# Patient Record
Sex: Male | Born: 1990 | Hispanic: Yes | Marital: Single | Smoking: Never smoker
Health system: Southern US, Community
[De-identification: ages and names within clinical notes are randomized; demographics above are authoritative.]

## PROBLEM LIST (undated history)

## (undated) DIAGNOSIS — R7303 Prediabetes: Secondary | ICD-10-CM

---

## 2017-08-12 ENCOUNTER — Emergency Department (HOSPITAL_COMMUNITY): Payer: Self-pay

## 2017-08-12 ENCOUNTER — Inpatient Hospital Stay (HOSPITAL_COMMUNITY): Payer: Self-pay | Admitting: Certified Registered Nurse Anesthetist

## 2017-08-12 ENCOUNTER — Inpatient Hospital Stay (HOSPITAL_COMMUNITY)
Admission: EM | Admit: 2017-08-12 | Discharge: 2017-08-14 | DRG: 339 | Disposition: A | Discharge status: Home / Self Care | Payer: Self-pay | Attending: General Surgery | Admitting: General Surgery

## 2017-08-12 ENCOUNTER — Encounter (HOSPITAL_COMMUNITY): Payer: Self-pay | Admitting: Emergency Medicine

## 2017-08-12 ENCOUNTER — Encounter (HOSPITAL_COMMUNITY): Admission: EM | Disposition: A | Discharge status: Home / Self Care | Payer: Self-pay | Source: Home / Self Care

## 2017-08-12 ENCOUNTER — Other Ambulatory Visit: Payer: Self-pay

## 2017-08-12 DIAGNOSIS — R7401 Elevation of levels of liver transaminase levels: Secondary | ICD-10-CM

## 2017-08-12 DIAGNOSIS — Z6841 Body Mass Index (BMI) 40.0 and over, adult: Secondary | ICD-10-CM

## 2017-08-12 DIAGNOSIS — E785 Hyperlipidemia, unspecified: Secondary | ICD-10-CM | POA: Diagnosis present

## 2017-08-12 DIAGNOSIS — K760 Fatty (change of) liver, not elsewhere classified: Secondary | ICD-10-CM | POA: Diagnosis present

## 2017-08-12 DIAGNOSIS — R7303 Prediabetes: Secondary | ICD-10-CM | POA: Diagnosis present

## 2017-08-12 DIAGNOSIS — D729 Disorder of white blood cells, unspecified: Secondary | ICD-10-CM

## 2017-08-12 DIAGNOSIS — R110 Nausea: Secondary | ICD-10-CM

## 2017-08-12 DIAGNOSIS — R740 Nonspecific elevation of levels of transaminase and lactic acid dehydrogenase [LDH]: Secondary | ICD-10-CM

## 2017-08-12 DIAGNOSIS — K3532 Acute appendicitis with perforation and localized peritonitis, without abscess: Principal | ICD-10-CM | POA: Diagnosis present

## 2017-08-12 DIAGNOSIS — R1031 Right lower quadrant pain: Secondary | ICD-10-CM

## 2017-08-12 HISTORY — PX: APPENDECTOMY: SHX54

## 2017-08-12 HISTORY — PX: LAPAROSCOPIC APPENDECTOMY: SHX408

## 2017-08-12 HISTORY — DX: Prediabetes: R73.03

## 2017-08-12 LAB — CBC
HEMATOCRIT: 44.7 % (ref 39.0–52.0)
HEMOGLOBIN: 14.9 g/dL (ref 13.0–17.0)
MCH: 30 pg (ref 26.0–34.0)
MCHC: 33.3 g/dL (ref 30.0–36.0)
MCV: 90.1 fL (ref 78.0–100.0)
Platelets: 299 10*3/uL (ref 150–400)
RBC: 4.96 MIL/uL (ref 4.22–5.81)
RDW: 13.3 % (ref 11.5–15.5)
WBC: 19.6 10*3/uL — ABNORMAL HIGH (ref 4.0–10.5)

## 2017-08-12 LAB — COMPREHENSIVE METABOLIC PANEL
ALBUMIN: 4.3 g/dL (ref 3.5–5.0)
ALT: 143 U/L — ABNORMAL HIGH (ref 17–63)
ANION GAP: 12 (ref 5–15)
AST: 45 U/L — ABNORMAL HIGH (ref 15–41)
Alkaline Phosphatase: 70 U/L (ref 38–126)
BILIRUBIN TOTAL: 1.1 mg/dL (ref 0.3–1.2)
BUN: 9 mg/dL (ref 6–20)
CHLORIDE: 99 mmol/L — AB (ref 101–111)
CO2: 25 mmol/L (ref 22–32)
Calcium: 9.6 mg/dL (ref 8.9–10.3)
Creatinine, Ser: 1 mg/dL (ref 0.61–1.24)
GFR calc Af Amer: >60 mL/min (ref >60)
GFR calc non Af Amer: >60 mL/min (ref >60)
GLUCOSE: 115 mg/dL — AB (ref 65–99)
POTASSIUM: 4.1 mmol/L (ref 3.5–5.1)
SODIUM: 136 mmol/L (ref 135–145)
TOTAL PROTEIN: 8.6 g/dL — AB (ref 6.5–8.1)

## 2017-08-12 LAB — URINALYSIS, ROUTINE W REFLEX MICROSCOPIC
BILIRUBIN URINE: NEGATIVE
Glucose, UA: NEGATIVE mg/dL
Hgb urine dipstick: NEGATIVE
Ketones, ur: NEGATIVE mg/dL
LEUKOCYTES UA: NEGATIVE
NITRITE: NEGATIVE
PH: 6 (ref 5.0–8.0)
Protein, ur: NEGATIVE mg/dL
SPECIFIC GRAVITY, URINE: 1.026 (ref 1.005–1.030)

## 2017-08-12 LAB — DIFFERENTIAL
ABS IMMATURE GRANULOCYTES: 0.1 10*3/uL (ref 0.0–0.1)
BASOS ABS: 0.1 10*3/uL (ref 0.0–0.1)
Basophils Relative: 0 %
Eosinophils Absolute: 0 10*3/uL (ref 0.0–0.7)
Eosinophils Relative: 0 %
Immature Granulocytes: 1 %
Lymphocytes Relative: 12 %
Lymphs Abs: 2.4 10*3/uL (ref 0.7–4.0)
Monocytes Absolute: 2.3 10*3/uL — ABNORMAL HIGH (ref 0.1–1.0)
Monocytes Relative: 12 %
NEUTROS ABS: 14.6 10*3/uL — AB (ref 1.7–7.7)
Neutrophils Relative %: 75 %

## 2017-08-12 LAB — I-STAT CG4 LACTIC ACID, ED: Lactic Acid, Venous: 1.94 mmol/L — ABNORMAL HIGH (ref 0.5–1.9)

## 2017-08-12 LAB — LIPASE, BLOOD: LIPASE: 29 U/L (ref 11–51)

## 2017-08-12 SURGERY — APPENDECTOMY, LAPAROSCOPIC
Anesthesia: General | Site: Abdomen

## 2017-08-12 MED ORDER — BUPIVACAINE HCL 0.25 % IJ SOLN
INTRAMUSCULAR | Status: DC | PRN
  Administered 2017-08-12: 5 mL

## 2017-08-12 MED ORDER — PIPERACILLIN-TAZOBACTAM 3.375 G IVPB
3.3750 g | Freq: Three times a day (TID) | INTRAVENOUS | Status: DC
  Administered 2017-08-12: 3.375 g via INTRAVENOUS
  Filled 2017-08-12: qty 50

## 2017-08-12 MED ORDER — HYDRALAZINE HCL 20 MG/ML IJ SOLN: 10.0000 mg | INTRAMUSCULAR | Status: DC | PRN

## 2017-08-12 MED ORDER — LIDOCAINE HCL (CARDIAC) PF 100 MG/5ML IV SOSY
PREFILLED_SYRINGE | INTRAVENOUS | Status: DC | PRN
  Administered 2017-08-12: 50 mg via INTRAVENOUS

## 2017-08-12 MED ORDER — HYDROMORPHONE HCL 2 MG/ML IJ SOLN: 1.0000 mg | INTRAMUSCULAR | Status: DC | PRN

## 2017-08-12 MED ORDER — HYDROMORPHONE HCL 1 MG/ML IJ SOLN: 1.0000 mg | INTRAMUSCULAR | Status: DC | PRN

## 2017-08-12 MED ORDER — DEXAMETHASONE SODIUM PHOSPHATE 10 MG/ML IJ SOLN
INTRAMUSCULAR | Status: DC | PRN
  Administered 2017-08-12: 10 mg via INTRAVENOUS

## 2017-08-12 MED ORDER — DIPHENHYDRAMINE HCL 25 MG PO CAPS: 25.0000 mg | ORAL_CAPSULE | Freq: Four times a day (QID) | ORAL | Status: DC | PRN

## 2017-08-12 MED ORDER — MORPHINE SULFATE (PF) 4 MG/ML IV SOLN
4.0000 mg | Freq: Once | INTRAVENOUS | Status: AC
Start: 2017-08-12 — End: 2017-08-12
  Administered 2017-08-12: 4 mg via INTRAVENOUS
  Filled 2017-08-12: qty 1

## 2017-08-12 MED ORDER — GABAPENTIN 300 MG PO CAPS
300.0000 mg | ORAL_CAPSULE | Freq: Two times a day (BID) | ORAL | Status: DC
  Administered 2017-08-12 – 2017-08-13 (×3): 300 mg via ORAL
  Filled 2017-08-12 (×3): qty 1

## 2017-08-12 MED ORDER — PROPOFOL 10 MG/ML IV BOLUS
INTRAVENOUS | Status: DC | PRN
  Administered 2017-08-12: 200 mg via INTRAVENOUS

## 2017-08-12 MED ORDER — ONDANSETRON HCL 4 MG/2ML IJ SOLN: 4.0000 mg | Freq: Four times a day (QID) | INTRAMUSCULAR | Status: DC | PRN

## 2017-08-12 MED ORDER — SUGAMMADEX SODIUM 500 MG/5ML IV SOLN
INTRAVENOUS | Status: AC
  Filled 2017-08-12: qty 5

## 2017-08-12 MED ORDER — SUGAMMADEX SODIUM 500 MG/5ML IV SOLN
INTRAVENOUS | Status: DC | PRN
  Administered 2017-08-12: 300 mg via INTRAVENOUS

## 2017-08-12 MED ORDER — HYDROMORPHONE HCL 2 MG/ML IJ SOLN: 0.2500 mg | INTRAMUSCULAR | Status: DC | PRN

## 2017-08-12 MED ORDER — CELECOXIB 200 MG PO CAPS
200.0000 mg | ORAL_CAPSULE | Freq: Two times a day (BID) | ORAL | Status: DC
  Administered 2017-08-12 – 2017-08-13 (×3): 200 mg via ORAL
  Filled 2017-08-12 (×3): qty 1

## 2017-08-12 MED ORDER — IOHEXOL 300 MG/ML  SOLN: 100.0000 mL | Freq: Once | INTRAMUSCULAR | Status: DC | PRN

## 2017-08-12 MED ORDER — SUCCINYLCHOLINE CHLORIDE 200 MG/10ML IV SOSY
PREFILLED_SYRINGE | INTRAVENOUS | Status: AC
  Filled 2017-08-12: qty 10

## 2017-08-12 MED ORDER — IOHEXOL 300 MG/ML  SOLN
100.0000 mL | Freq: Once | INTRAMUSCULAR | Status: AC
  Administered 2017-08-12: 100 mL via INTRAVENOUS

## 2017-08-12 MED ORDER — SUCCINYLCHOLINE CHLORIDE 20 MG/ML IJ SOLN
INTRAMUSCULAR | Status: DC | PRN
  Administered 2017-08-12: 160 mg via INTRAVENOUS

## 2017-08-12 MED ORDER — ROCURONIUM BROMIDE 100 MG/10ML IV SOLN
INTRAVENOUS | Status: DC | PRN
  Administered 2017-08-12: 30 mg via INTRAVENOUS
  Administered 2017-08-12: 10 mg via INTRAVENOUS

## 2017-08-12 MED ORDER — DIPHENHYDRAMINE HCL 50 MG/ML IJ SOLN: 25.0000 mg | Freq: Four times a day (QID) | INTRAMUSCULAR | Status: DC | PRN

## 2017-08-12 MED ORDER — ACETAMINOPHEN 325 MG PO TABS: 650.0000 mg | ORAL_TABLET | Freq: Four times a day (QID) | ORAL | Status: DC | PRN

## 2017-08-12 MED ORDER — 0.9 % SODIUM CHLORIDE (POUR BTL) OPTIME
TOPICAL | Status: DC | PRN
  Administered 2017-08-12: 1000 mL

## 2017-08-12 MED ORDER — FAMOTIDINE IN NACL 20-0.9 MG/50ML-% IV SOLN
20.0000 mg | INTRAVENOUS | Status: DC
  Administered 2017-08-12: 20 mg via INTRAVENOUS
  Filled 2017-08-12: qty 50

## 2017-08-12 MED ORDER — ACETAMINOPHEN 650 MG RE SUPP: 650.0000 mg | Freq: Four times a day (QID) | RECTAL | Status: DC | PRN

## 2017-08-12 MED ORDER — METRONIDAZOLE IN NACL 5-0.79 MG/ML-% IV SOLN: 500.0000 mg | Freq: Once | INTRAVENOUS | Status: DC

## 2017-08-12 MED ORDER — SODIUM CHLORIDE 0.9 % IR SOLN
Status: DC | PRN
  Administered 2017-08-12: 1000 mL

## 2017-08-12 MED ORDER — ONDANSETRON 4 MG PO TBDP
4.0000 mg | ORAL_TABLET | Freq: Four times a day (QID) | ORAL | Status: DC | PRN
Start: 2017-08-12 — End: 2017-08-12

## 2017-08-12 MED ORDER — MIDAZOLAM HCL 2 MG/2ML IJ SOLN
INTRAMUSCULAR | Status: AC
  Filled 2017-08-12: qty 2

## 2017-08-12 MED ORDER — ONDANSETRON HCL 4 MG/2ML IJ SOLN
4.0000 mg | Freq: Four times a day (QID) | INTRAMUSCULAR | Status: DC | PRN
Start: 2017-08-12 — End: 2017-08-12

## 2017-08-12 MED ORDER — TRAMADOL HCL 50 MG PO TABS: 50.0000 mg | ORAL_TABLET | Freq: Four times a day (QID) | ORAL | Status: DC | PRN

## 2017-08-12 MED ORDER — OXYCODONE HCL 5 MG PO TABS
5.0000 mg | ORAL_TABLET | ORAL | Status: DC | PRN
  Administered 2017-08-12 – 2017-08-13 (×2): 5 mg via ORAL
  Filled 2017-08-12 (×2): qty 1

## 2017-08-12 MED ORDER — BUPIVACAINE HCL (PF) 0.25 % IJ SOLN
INTRAMUSCULAR | Status: AC
  Filled 2017-08-12: qty 30

## 2017-08-12 MED ORDER — LACTATED RINGERS IV SOLN
INTRAVENOUS | Status: DC | PRN
  Administered 2017-08-12 (×2): via INTRAVENOUS

## 2017-08-12 MED ORDER — FENTANYL CITRATE (PF) 100 MCG/2ML IJ SOLN
INTRAMUSCULAR | Status: DC | PRN
  Administered 2017-08-12: 150 ug via INTRAVENOUS
  Administered 2017-08-12: 100 ug via INTRAVENOUS

## 2017-08-12 MED ORDER — ACETAMINOPHEN 500 MG PO TABS
1000.0000 mg | ORAL_TABLET | Freq: Once | ORAL | Status: AC
  Administered 2017-08-12: 1000 mg via ORAL
  Filled 2017-08-12: qty 2

## 2017-08-12 MED ORDER — DEXAMETHASONE SODIUM PHOSPHATE 10 MG/ML IJ SOLN
INTRAMUSCULAR | Status: AC
  Filled 2017-08-12: qty 1

## 2017-08-12 MED ORDER — LIDOCAINE 2% (20 MG/ML) 5 ML SYRINGE
INTRAMUSCULAR | Status: AC
  Filled 2017-08-12: qty 5

## 2017-08-12 MED ORDER — ONDANSETRON HCL 4 MG/2ML IJ SOLN
4.0000 mg | Freq: Once | INTRAMUSCULAR | Status: AC
  Administered 2017-08-12: 4 mg via INTRAVENOUS
  Filled 2017-08-12: qty 2

## 2017-08-12 MED ORDER — SODIUM CHLORIDE 0.9 % IV SOLN
1.0000 g | Freq: Once | INTRAVENOUS | Status: AC
  Administered 2017-08-12: 1 g via INTRAVENOUS
  Filled 2017-08-12: qty 10

## 2017-08-12 MED ORDER — KCL IN DEXTROSE-NACL 20-5-0.45 MEQ/L-%-% IV SOLN
INTRAVENOUS | Status: DC
  Filled 2017-08-12: qty 1000

## 2017-08-12 MED ORDER — ONDANSETRON 4 MG PO TBDP
4.0000 mg | ORAL_TABLET | Freq: Four times a day (QID) | ORAL | Status: DC | PRN
  Administered 2017-08-12: 4 mg via ORAL
  Filled 2017-08-12: qty 1

## 2017-08-12 MED ORDER — LACTATED RINGERS IV SOLN
INTRAVENOUS | Status: DC
  Administered 2017-08-12: 18:00:00 via INTRAVENOUS

## 2017-08-12 MED ORDER — ROCURONIUM BROMIDE 50 MG/5ML IV SOLN
INTRAVENOUS | Status: AC
  Filled 2017-08-12: qty 1

## 2017-08-12 MED ORDER — SODIUM CHLORIDE 0.9 % IV BOLUS
2000.0000 mL | Freq: Once | INTRAVENOUS | Status: AC
  Administered 2017-08-12: 2000 mL via INTRAVENOUS

## 2017-08-12 MED ORDER — ONDANSETRON HCL 4 MG/2ML IJ SOLN
INTRAMUSCULAR | Status: DC | PRN
  Administered 2017-08-12: 4 mg via INTRAVENOUS

## 2017-08-12 MED ORDER — FENTANYL CITRATE (PF) 250 MCG/5ML IJ SOLN
INTRAMUSCULAR | Status: AC
  Filled 2017-08-12: qty 5

## 2017-08-12 MED ORDER — MORPHINE SULFATE (PF) 4 MG/ML IV SOLN: 1.0000 mg | INTRAVENOUS | Status: DC | PRN

## 2017-08-12 MED ORDER — OXYCODONE HCL 5 MG PO TABS: 5.0000 mg | ORAL_TABLET | ORAL | Status: DC | PRN

## 2017-08-12 MED ORDER — METOPROLOL TARTRATE 5 MG/5ML IV SOLN: 5.0000 mg | Freq: Four times a day (QID) | INTRAVENOUS | Status: DC | PRN

## 2017-08-12 MED ORDER — DEXTROSE-NACL 5-0.9 % IV SOLN
INTRAVENOUS | Status: DC
  Administered 2017-08-12 – 2017-08-13 (×2): via INTRAVENOUS

## 2017-08-12 MED ORDER — ONDANSETRON HCL 4 MG/2ML IJ SOLN
INTRAMUSCULAR | Status: AC
  Filled 2017-08-12: qty 2

## 2017-08-12 MED ORDER — PROPOFOL 10 MG/ML IV BOLUS
INTRAVENOUS | Status: AC
  Filled 2017-08-12: qty 40

## 2017-08-12 MED ORDER — PIPERACILLIN-TAZOBACTAM 3.375 G IVPB
3.3750 g | Freq: Three times a day (TID) | INTRAVENOUS | Status: DC
  Administered 2017-08-12 – 2017-08-13 (×4): 3.375 g via INTRAVENOUS
  Filled 2017-08-12 (×6): qty 50

## 2017-08-12 MED ORDER — ENOXAPARIN SODIUM 40 MG/0.4ML ~~LOC~~ SOLN: 40.0000 mg | SUBCUTANEOUS | Status: DC

## 2017-08-12 SURGICAL SUPPLY — 51 items
APPLIER CLIP 5 13 M/L LIGAMAX5 (MISCELLANEOUS)
BENZOIN TINCTURE PRP APPL 2/3 (GAUZE/BANDAGES/DRESSINGS) IMPLANT
BLADE CLIPPER SURG (BLADE) IMPLANT
CANISTER SUCT 3000ML PPV (MISCELLANEOUS) ×3 IMPLANT
CHLORAPREP W/TINT 26ML (MISCELLANEOUS) ×3 IMPLANT
CLIP APPLIE 5 13 M/L LIGAMAX5 (MISCELLANEOUS) IMPLANT
CLOSURE WOUND 1/2 X4 (GAUZE/BANDAGES/DRESSINGS) ×1
CONT SPEC 4OZ CLIKSEAL STRL BL (MISCELLANEOUS) ×3 IMPLANT
COVER SURGICAL LIGHT HANDLE (MISCELLANEOUS) ×3 IMPLANT
COVER TRANSDUCER ULTRASND (DRAPES) ×3 IMPLANT
DERMABOND ADHESIVE PROPEN (GAUZE/BANDAGES/DRESSINGS) ×2
DERMABOND ADVANCED .7 DNX6 (GAUZE/BANDAGES/DRESSINGS) ×1 IMPLANT
DRAPE LAPAROSCOPIC ABDOMINAL (DRAPES) ×3 IMPLANT
ELECT REM PT RETURN 9FT ADLT (ELECTROSURGICAL) ×3
ELECTRODE REM PT RTRN 9FT ADLT (ELECTROSURGICAL) ×1 IMPLANT
ENDOLOOP SUT PDS II  0 18 (SUTURE) ×6
ENDOLOOP SUT PDS II 0 18 (SUTURE) ×3 IMPLANT
GAUZE SPONGE 2X2 8PLY STRL LF (GAUZE/BANDAGES/DRESSINGS) ×1 IMPLANT
GLOVE BIO SURGEON STRL SZ 6.5 (GLOVE) ×2 IMPLANT
GLOVE BIO SURGEON STRL SZ7.5 (GLOVE) ×3 IMPLANT
GLOVE BIO SURGEONS STRL SZ 6.5 (GLOVE) ×1
GLOVE BIOGEL PI IND STRL 6.5 (GLOVE) ×2 IMPLANT
GLOVE BIOGEL PI INDICATOR 6.5 (GLOVE) ×4
GLOVE ECLIPSE 6.5 STRL STRAW (GLOVE) ×3 IMPLANT
GOWN STRL REUS W/ TWL LRG LVL3 (GOWN DISPOSABLE) ×2 IMPLANT
GOWN STRL REUS W/ TWL XL LVL3 (GOWN DISPOSABLE) ×1 IMPLANT
GOWN STRL REUS W/TWL LRG LVL3 (GOWN DISPOSABLE) ×4
GOWN STRL REUS W/TWL XL LVL3 (GOWN DISPOSABLE) ×2
GRASPER SUT TROCAR 14GX15 (MISCELLANEOUS) ×3 IMPLANT
KIT BASIN OR (CUSTOM PROCEDURE TRAY) ×3 IMPLANT
KIT TURNOVER KIT B (KITS) ×3 IMPLANT
NEEDLE INSUFFLATION 14GA 120MM (NEEDLE) ×3 IMPLANT
NS IRRIG 1000ML POUR BTL (IV SOLUTION) ×3 IMPLANT
PAD ARMBOARD 7.5X6 YLW CONV (MISCELLANEOUS) ×6 IMPLANT
POUCH RETRIEVAL ECOSAC 10 (ENDOMECHANICALS) ×1 IMPLANT
POUCH RETRIEVAL ECOSAC 10MM (ENDOMECHANICALS) ×2
SCISSORS LAP 5X35 DISP (ENDOMECHANICALS) ×3 IMPLANT
SET IRRIG TUBING LAPAROSCOPIC (IRRIGATION / IRRIGATOR) ×3 IMPLANT
SLEEVE ENDOPATH XCEL 5M (ENDOMECHANICALS) ×3 IMPLANT
SPECIMEN JAR SMALL (MISCELLANEOUS) ×3 IMPLANT
SPONGE GAUZE 2X2 STER 10/PKG (GAUZE/BANDAGES/DRESSINGS) ×2
STRIP CLOSURE SKIN 1/2X4 (GAUZE/BANDAGES/DRESSINGS) ×2 IMPLANT
SUT MNCRL AB 4-0 PS2 18 (SUTURE) ×3 IMPLANT
TOWEL OR 17X24 6PK STRL BLUE (TOWEL DISPOSABLE) ×3 IMPLANT
TOWEL OR 17X26 10 PK STRL BLUE (TOWEL DISPOSABLE) ×3 IMPLANT
TRAY FOLEY CATH SILVER 16FR (SET/KITS/TRAYS/PACK) ×3 IMPLANT
TRAY LAPAROSCOPIC MC (CUSTOM PROCEDURE TRAY) ×3 IMPLANT
TROCAR XCEL NON-BLD 11X100MML (ENDOMECHANICALS) ×3 IMPLANT
TROCAR XCEL NON-BLD 5MMX100MML (ENDOMECHANICALS) ×6 IMPLANT
TUBING INSUFFLATION (TUBING) ×3 IMPLANT
WATER STERILE IRR 1000ML POUR (IV SOLUTION) ×3 IMPLANT

## 2017-08-12 NOTE — Anesthesia Procedure Notes (Signed)
Procedure Name: Intubation Date/Time: 08/12/2017 6:13 PM Performed by: Eligha Bridegroom, CRNA Pre-anesthesia Checklist: Patient identified, Emergency Drugs available, Suction available, Patient being monitored and Timeout performed Patient Re-evaluated:Patient Re-evaluated prior to induction Oxygen Delivery Method: Circle system utilized Preoxygenation: Pre-oxygenation with 100% oxygen Induction Type: IV induction, Rapid sequence and Cricoid Pressure applied Laryngoscope Size: Mac and 4 Grade View: Grade I Tube type: Oral Tube size: 7.5 mm Number of attempts: 1 Airway Equipment and Method: Stylet Placement Confirmation: ETT inserted through vocal cords under direct vision,  positive ETCO2 and breath sounds checked- equal and bilateral Secured at: 21 cm Tube secured with: Tape Dental Injury: Teeth and Oropharynx as per pre-operative assessment

## 2017-08-12 NOTE — ED Notes (Signed)
Patient transported to CT 

## 2017-08-12 NOTE — ED Notes (Signed)
Patient to ED for further evaluation of RLQ abdominal pain with nausea and fevers x 3 days. Pt saw PCP twice, yesterday had bloodwork showing elevated LFTs and WBC. Patient febrile in triage. RLQ tender to light palpation and rebound tenderness noted.

## 2017-08-12 NOTE — Anesthesia Postprocedure Evaluation (Signed)
Anesthesia Post Note  Patient: Ryan Richardson  Procedure(s) Performed: APPENDECTOMY LAPAROSCOPIC (N/A Abdomen)     Patient location during evaluation: PACU Anesthesia Type: General Level of consciousness: sedated Pain management: pain level controlled Vital Signs Assessment: post-procedure vital signs reviewed and stable Respiratory status: spontaneous breathing and respiratory function stable Cardiovascular status: stable Postop Assessment: no apparent nausea or vomiting Anesthetic complications: no    Last Vitals:  Vitals:   08/12/17 2000 08/12/17 2008  BP:    Pulse: (!) 111 (!) 108  Resp: (!) 1 (!) 23  Temp: 37.2 C   SpO2: 93% 93%    Last Pain:  Vitals:   08/12/17 2000  TempSrc:   PainSc: 3                  Ryan Richardson

## 2017-08-12 NOTE — Op Note (Signed)
08/12/2017  7:07 PM  PATIENT:  Ryan Richardson  27 y.o. male  PRE-OPERATIVE DIAGNOSIS:  Acute appendicitis  POST-OPERATIVE DIAGNOSIS:  Acute perforated appendicitis  PROCEDURE:  Procedure(s): APPENDECTOMY LAPAROSCOPIC (N/A)  SURGEON:  Surgeon(s) and Role:    Axel Filler* Josselyne Onofrio, MD - Primary   ANESTHESIA:   local and general  EBL:  minimal   BLOOD ADMINISTERED:none  DRAINS: none   LOCAL MEDICATIONS USED:  BUPIVICAINE   SPECIMEN:  Source of Specimen:  appendix  DISPOSITION OF SPECIMEN:  PATHOLOGY  COUNTS:  YES  TOURNIQUET:  * No tourniquets in log *  DICTATION: .Dragon Dictation Complications: none  Counts: reported as correct x 2  Findings:  The patient had a acutely inflamed perforated retrocecal appendix  Specimen: Appendix  Indications for procedure:  The patient is a 27 year old male with a history of periumbilical pain localized in the right lower quadrant patient had a CT scan which revealed signs consistent with acute perforated appendicitis the patient back in for laparoscopic appendectomy.  Details of the procedure:The patient was taken back to the operating room. The patient was placed in supine position with bilateral SCDs in place.  A foley catheter was place. The patient was prepped and draped in the usual sterile fashion.  After appropriate anitbiotics were confirmed, a time-out was confirmed and all facts were verified.    A pneumoperitoneum of 14 mmHg was obtained via a Veress needle technique in the left lower quadrant quadrant.  A 5 mm trocar and 5 mm camera then placed intra-abdominally there is no injury to any intra-abdominal organs a 10 mm infraumbilical port was placed and direct visualization as was a 5 mm port in the suprapubic area.   The appendix was identified and seen to be perforated.  There was no large abscess or purulence, however there was a fecolith seen.  The appendix was cleaned down to the appendiceal base. The mesoappendix was then  incised and the appendiceal artery was cauterized.  The the appendiceal base was clean.  At this time an Endoloop was placed proximallyx2 and one distally and the appendix was transected between these 2. A retrieval bag was then placed into the abdomen and the specimen placed in the bag. The appendiceal stump was cauterized. We evacuate the fluid from the pelvis until the effluent was clear.  The appendix and retrieval  bag was then retrieved via the supraumbilical port. #1 Vicryl was used to reapproximate the fascia at the umbilical port site x1. The skin was reapproximated all port sites 3-0 Monocryl subcuticular fashion. The skin was dressed withDermabond.  The patient had the foley removed. The patient was awakened from general anesthesia was taken to recovery room in stable condition.      PLAN OF CARE: Admit for overnight observation  PATIENT DISPOSITION:  PACU - hemodynamically stable.   Delay start of Pharmacological VTE agent (>24hrs) due to surgical blood loss or risk of bleeding: not applicable

## 2017-08-12 NOTE — Transfer of Care (Signed)
Immediate Anesthesia Transfer of Care Note  Patient: Ryan PayorDavid Barretto  Procedure(s) Performed: APPENDECTOMY LAPAROSCOPIC (N/A Abdomen)  Patient Location: PACU  Anesthesia Type:General  Level of Consciousness: awake and alert   Airway & Oxygen Therapy: Patient Spontanous Breathing and Patient connected to nasal cannula oxygen  Post-op Assessment: Report given to RN and Post -op Vital signs reviewed and stable  Post vital signs: Reviewed and stable  Last Vitals:  Vitals Value Taken Time  BP 128/75 08/12/2017  7:29 PM  Temp    Pulse 118 08/12/2017  7:33 PM  Resp 16 08/12/2017  7:33 PM  SpO2 96 % 08/12/2017  7:33 PM  Vitals shown include unvalidated device data.  Last Pain:  Vitals:   08/12/17 1600  TempSrc:   PainSc: 8          Complications: No apparent anesthesia complications

## 2017-08-12 NOTE — Anesthesia Preprocedure Evaluation (Addendum)
Anesthesia Evaluation  Patient identified by MRN, date of birth, ID band Patient awake    Reviewed: Allergy & Precautions, H&P , NPO status , Patient's Chart, lab work & pertinent test results  Airway Mallampati: III  TM Distance: >3 FB Neck ROM: Full    Dental no notable dental hx. (+) Teeth Intact, Dental Advisory Given   Pulmonary neg pulmonary ROS,    Pulmonary exam normal breath sounds clear to auscultation       Cardiovascular negative cardio ROS   Rhythm:Regular Rate:Normal     Neuro/Psych negative neurological ROS  negative psych ROS   GI/Hepatic negative GI ROS, Neg liver ROS,   Endo/Other  Morbid obesity  Renal/GU negative Renal ROS  negative genitourinary   Musculoskeletal   Abdominal   Peds  Hematology negative hematology ROS (+)   Anesthesia Other Findings   Reproductive/Obstetrics negative OB ROS                           Anesthesia Physical Anesthesia Plan  ASA: II  Anesthesia Plan: General   Post-op Pain Management:    Induction: Intravenous, Rapid sequence and Cricoid pressure planned  PONV Risk Score and Plan: 3 and Ondansetron, Dexamethasone and Midazolam  Airway Management Planned: Oral ETT  Additional Equipment:   Intra-op Plan:   Post-operative Plan: Extubation in OR  Informed Consent: I have reviewed the patients History and Physical, chart, labs and discussed the procedure including the risks, benefits and alternatives for the proposed anesthesia with the patient or authorized representative who has indicated his/her understanding and acceptance.   Dental advisory given  Plan Discussed with: CRNA  Anesthesia Plan Comments:       Anesthesia Quick Evaluation

## 2017-08-12 NOTE — H&P (Signed)
Ryan Richardson 1991/02/10  [id number].   Chief Complaint/Reason for Consult: acute perforated appendicitis  HPI:  This is a 27 yo Hispanic male who has a history of hypertriglyceridemia and prediabetic who began having some generalized abdominal pain on Sunday with some nausea, but no emesis.  He has had constipation, but no diarrhea.  He admits to feeling somewhat hot at times and intermittent chills, but has never taken his temperature.  Here he has a fever of 103.  Because of worsening pain that diclofenac didn't help, he saw his primary care physician this morning who referred him here to the ED.  He had a CT scan which revealed acute perforated appendicitis with some fluid, but no abscess.  He is tachycardic with a high temp.  We have been asked to see the patient.  ROS: ROS: please see HPI, otherwise all other systems are negative.  No family history on file.  History reviewed. No pertinent past medical history.  History reviewed. No pertinent surgical history.  Social History:  reports that he has never smoked. He has never used smokeless tobacco. He reports that he drinks alcohol. 12 beers on Saturday. He reports that he does not use drugs.  Allergies: No Known Allergies   (Not in a hospital admission)   Physical Exam: Blood pressure 118/66, pulse (!) 113, temperature 99.7 F (37.6 C), temperature source Oral, resp. rate 18, SpO2 92 %. General: pleasant, WD, WN Hispanic male who is laying in bed in NAD HEENT: head is normocephalic, atraumatic.  Sclera are noninjected.  PERRL.  Ears and nose without any masses or lesions.  Mouth is pink and moist Heart: regular rhythm, but tachy.  Normal s1,s2. No obvious murmurs, gallops, or rubs noted.  Palpable radial and pedal pulses bilaterally Lungs: CTAB, no wheezes, rhonchi, or rales noted.  Respiratory effort nonlabored Abd: soft, tender in RLQ where his appendix is located, ND/obese, hypoactive BS, no masses, hernias, or  organomegaly MS: all 4 extremities are symmetrical with no cyanosis, clubbing, or edema. Skin: warm and dry with no masses, lesions, or rashes Psych: A&Ox3 with an appropriate affect.   Results for orders placed or performed during the hospital encounter of 08/12/17 (from the past 48 hour(s))  Urinalysis, Routine w reflex microscopic     Status: None   Collection Time: 08/12/17 11:33 AM  Result Value Ref Range   Color, Urine YELLOW YELLOW   APPearance CLEAR CLEAR   Specific Gravity, Urine 1.026 1.005 - 1.030   pH 6.0 5.0 - 8.0   Glucose, UA NEGATIVE NEGATIVE mg/dL   Hgb urine dipstick NEGATIVE NEGATIVE   Bilirubin Urine NEGATIVE NEGATIVE   Ketones, ur NEGATIVE NEGATIVE mg/dL   Protein, ur NEGATIVE NEGATIVE mg/dL   Nitrite NEGATIVE NEGATIVE   Leukocytes, UA NEGATIVE NEGATIVE    Comment: Performed at [facility] [address] [place], [place] [address]  Lipase, blood     Status: None   Collection Time: 08/12/17 11:40 AM  Result Value Ref Range   Lipase 29 11 - 51 U/L    Comment: Performed at [facility] [address] [place], [place] [address]  Comprehensive metabolic panel     Status: Abnormal   Collection Time: 08/12/17 11:40 AM  Result Value Ref Range   Sodium 136 135 - 145 mmol/L   Potassium 4.1 3.5 - 5.1 mmol/L   Chloride 99 (L) 101 - 111 mmol/L   CO2 25 22 - 32 mmol/L  Glucose, Bld 115 (H) 65 - 99 mg/dL   BUN 9 6 - 20 mg/dL   Creatinine, Ser 1.00 0.61 - 1.24 mg/dL   Calcium 9.6 8.9 - 10.3 mg/dL   Total Protein 8.6 (H) 6.5 - 8.1 g/dL   Albumin 4.3 3.5 - 5.0 g/dL   AST 45 (H) 15 - 41 U/L   ALT 143 (H) 17 - 63 U/L   Alkaline Phosphatase 70 38 - 126 U/L   Total Bilirubin 1.1 0.3 - 1.2 mg/dL   GFR calc non Af Amer >60 >60 mL/min   GFR calc Af Amer >60 >60 mL/min    Comment: (NOTE) The eGFR has been calculated using the CKD EPI equation. This calculation has not been validated in all clinical situations. eGFR's persistently <60 mL/min signify  possible Chronic Kidney Disease.    Anion gap 12 5 - 15    Comment: Performed at [facility] [address] [place], [place] [address]  CBC     Status: Abnormal   Collection Time: 08/12/17 11:40 AM  Result Value Ref Range   WBC 19.6 (H) 4.0 - 10.5 K/uL   RBC 4.96 4.22 - 5.81 MIL/uL   Hemoglobin 14.9 13.0 - 17.0 g/dL   HCT 44.7 39.0 - 52.0 %   MCV 90.1 78.0 - 100.0 fL   MCH 30.0 26.0 - 34.0 pg   MCHC 33.3 30.0 - 36.0 g/dL   RDW 13.3 11.5 - 15.5 %   Platelets 299 150 - 400 K/uL    Comment: Performed at [facility], [place] [address] [place],  [address]  Differential     Status: Abnormal   Collection Time: 08/12/17 11:40 AM  Result Value Ref Range   Neutrophils Relative % 75 %   Neutro Abs 14.6 (H) 1.7 - 7.7 K/uL   Lymphocytes Relative 12 %   Lymphs Abs 2.4 0.7 - 4.0 K/uL   Monocytes Relative 12 %   Monocytes Absolute 2.3 (H) 0.1 - 1.0 K/uL   Eosinophils Relative 0 %   Eosinophils Absolute 0.0 0.0 - 0.7 K/uL   Basophils Relative 0 %   Basophils Absolute 0.1 0.0 - 0.1 K/uL   Immature Granulocytes 1 %   Abs Immature Granulocytes 0.1 0.0 - 0.1 K/uL    Comment: Performed at [facility] [address] [place], [place] [address]  I-Stat CG4 Lactic Acid, ED     Status: Abnormal   Collection Time: 08/12/17  1:03 PM  Result Value Ref Range   Lactic Acid, Venous 1.94 (H) 0.5 - 1.9 mmol/L   Ct Abdomen Pelvis W Contrast  Result Date: 08/12/2017 CLINICAL DATA:  Right lower quadrant pain, nausea and fever for 3 days. Elevated white blood cell count and liver function tests. EXAM: CT ABDOMEN AND PELVIS WITH CONTRAST TECHNIQUE: Multidetector CT imaging of the abdomen and pelvis was performed using the standard protocol following bolus administration of intravenous contrast. CONTRAST:  100 ml OMNIPAQUE IOHEXOL 300 MG/ML  SOLN COMPARISON:  None. FINDINGS: Lower chest: Dependent bibasilar atelectasis is noted. No pleural or pericardial effusion. Hepatobiliary: The  liver is diffusely low attenuating consistent with fatty infiltration. The liver measures 21 cm craniocaudal. No focal lesion or intrahepatic biliary ductal dilatation. The gallbladder appears normal. Pancreas: Unremarkable. No pancreatic ductal dilatation or surrounding inflammatory changes. Spleen: Normal in size without focal abnormality. Adrenals/Urinary Tract: Adrenal glands are unremarkable. Kidneys are normal, without renal calculi, focal lesion, or hydronephrosis. Bladder is unremarkable. Stomach/Bowel: Appendix: Location:  Retrocecal in the right lower quadrant. Diameter: 1.7 cm Appendicolith: 2 are identified measuring 0.9 and 0.8 cm Mucosal hyper-enhancement: Yes Extraluminal gas: Negative Periappendiceal collection: Ill-defined fluid is present about the appendix and the wall of the appendix appears markedly irregular. The stomach, small bowel and colon appear normal. Vascular/Lymphatic: No significant vascular findings are present. No enlarged abdominal or pelvic lymph nodes. Reproductive: Prostate is unremarkable. Other: None. Musculoskeletal: Negative. IMPRESSION: The study is positive for acute appendicitis with surrounding fluid most consistent rupture and phlegmon. No abscess. Hepatomegaly and fatty infiltration liver. These results were called by telephone at the time of interpretation on 08/12/2017 at 2:52 pm to. [facility] Who verbally acknowledged these results. Electronically Signed   By: Inge Rise M.D.   On: 08/12/2017 14:54      Assessment/Plan Acute perforated appendicitis -admit -will start zosyn given this is complicated appendicitis. -NPO for now, may develop post op ileus. -IV pain meds, oral after surgery -plan for OR tonight for lap appy.  I have discussed the procedure and risks of appendectomy. The risks include but are not limited to bleeding, infection, wound problems, anesthesia, injury to intra-abdominal organs, possibility of postoperative ileus.  He seems to understand and agrees with the plan.   Henreitta Cea, [facility] Surgery 08/12/2017, 3:51 PM Pager: [phone]

## 2017-08-12 NOTE — ED Provider Notes (Signed)
[facility] Provider Note   CSN: [id number] Arrival date & time: 08/12/17  1112     History   Chief Complaint Chief Complaint  Patient presents with  . Abdominal Pain    HPI Ryan Richardson is a 27 y.o. male with a PMHx of HLD and prediabetes, who presents to the ED with complaints of abdominal pain that began Sunday night, 2 days ago.  Patient states that his abdominal pain started as periumbilical pain but then localized into the RLQ, where it has remained and continued to worsen.  He describes the pain as 9/10 constant nonradiating pressure-like RLQ pain that worsens with movement and has been unrelieved with a laxative and a TEE that he was given.  He reports associated symptoms of fever with Tmax of 102.3, loss of appetite, and nausea.  He has not taken anything today for his fever.  He [facility] P[facility] and was given 1 g IM Rocephin and sent here for further evaluation of possible appendicitis.  He has not eaten anything today, just had a few sips of water and that is it.  He admits that he drank a small amount of alcohol on Saturday night but he felt very full so he stopped drinking, he states that he usually drinks on the weekends but not very heavily.  He denies CP, SOB, vomiting, diarrhea/constipation, melena, hematochezia, hematuria, dysuria, myalgias, arthralgias, numbness, tingling, focal weakness, or any other complaints at this time. Denies recent travel, sick contacts, suspicious food intake, frequent NSAID use, or prior abd surgeries.   The history is provided by the patient and medical records. A language interpreter was used (provider).  Abdominal Pain   Associated symptoms include fever and nausea. Pertinent negatives include diarrhea, vomiting, constipation, dysuria, hematuria, arthralgias and myalgias.    History reviewed. No pertinent past medical history.  There are no active problems to  display for this patient.   History reviewed. No pertinent surgical history.      Home Medications    Prior to Admission medications   Not on File    Family History No family history on file.  Social History Social History   Tobacco Use  . Smoking status: Never Smoker  . Smokeless tobacco: Never Used  Substance Use Topics  . Alcohol use: Yes    Comment: on weekends  . Drug use: Never     Allergies   Patient has no known allergies.   Review of Systems Review of Systems  Constitutional: Positive for appetite change and fever.  Respiratory: Negative for shortness of breath.   Cardiovascular: Negative for chest pain.  Gastrointestinal: Positive for abdominal pain and nausea. Negative for blood in stool, constipation, diarrhea and vomiting.  Genitourinary: Negative for dysuria and hematuria.  Musculoskeletal: Negative for arthralgias and myalgias.  Skin: Negative for color change.  Allergic/Immunologic: Negative for immunocompromised state.  Neurological: Negative for weakness and numbness.  Psychiatric/Behavioral: Negative for confusion.   All other systems reviewed and are negative for acute change except as noted in the HPI.    Physical Exam Updated Vital Signs BP 121/74 (BP Location: Right Arm)   Pulse (!) 120   Temp (!) 102.3 F (39.1 C) (Oral)   Resp 18   SpO2 97%   Physical Exam  Constitutional: He is oriented to person, place, and time. He appears well-developed and well-nourished.  Non-toxic appearance. No distress.  Febrile to 102.3, nontoxic, NAD, overall fairly well appearing,  not septic appearing  HENT:  Head: Normocephalic and atraumatic.  Mouth/Throat: Oropharynx is clear and moist and mucous membranes are normal.  Eyes: Conjunctivae and EOM are normal. Right eye exhibits no discharge. Left eye exhibits no discharge.  Neck: Normal range of motion. Neck supple.  Cardiovascular: Regular rhythm, normal heart sounds and intact distal pulses.  Tachycardia present. Exam reveals no gallop and no friction rub.  No murmur heard. Tachycardic in the 110-120s  Pulmonary/Chest: Effort normal and breath sounds normal. No respiratory distress. He has no decreased breath sounds. He has no wheezes. He has no rhonchi. He has no rales.  Abdominal: Soft. Normal appearance. He exhibits no distension. Bowel sounds are decreased. There is tenderness in the right upper quadrant and right lower quadrant. There is rebound, guarding and tenderness at McBurney's point. There is no rigidity, no CVA tenderness and negative Murphy's sign.  Soft, nondistended, +BS throughout although somewhat hypoactive, with mild RUQ TTP and moderate RLQ TTP, with some slight guarding and rebound but no rigidity, neg murphy's, +mcburney's, no CVA TTP, neg foot tap test and neg psoas sign.   Musculoskeletal: Normal range of motion.  Neurological: He is alert and oriented to person, place, and time. He has normal strength. No sensory deficit.  Skin: Skin is warm, dry and intact. No rash noted.  Psychiatric: He has a normal mood and affect.  Nursing note and vitals reviewed.    ED Treatments / Results  Labs (all labs ordered are listed, but only abnormal results are displayed) Labs Reviewed  COMPREHENSIVE METABOLIC PANEL - Abnormal; Notable for the following components:      Result Value   Chloride 99 (*)    Glucose, Bld 115 (*)    Total Protein 8.6 (*)    AST 45 (*)    ALT 143 (*)    All other components within normal limits  CBC - Abnormal; Notable for the following components:   WBC 19.6 (*)    All other components within normal limits  DIFFERENTIAL - Abnormal; Notable for the following components:   Neutro Abs 14.6 (*)    Monocytes Absolute 2.3 (*)    All other components within normal limits  I-STAT CG4 LACTIC ACID, ED - Abnormal; Notable for the following components:   Lactic Acid, Venous 1.94 (*)    All other components within normal limits  LIPASE, BLOOD    URINALYSIS, ROUTINE W REFLEX MICROSCOPIC  LACTIC ACID, PLASMA    EKG None  Radiology Ct Abdomen Pelvis W Contrast  Result Date: 08/12/2017 CLINICAL DATA:  Right lower quadrant pain, nausea and fever for 3 days. Elevated white blood cell count and liver function tests. EXAM: CT ABDOMEN AND PELVIS WITH CONTRAST TECHNIQUE: Multidetector CT imaging of the abdomen and pelvis was performed using the standard protocol following bolus administration of intravenous contrast. CONTRAST:  100 ml OMNIPAQUE IOHEXOL 300 MG/ML  SOLN COMPARISON:  None. FINDINGS: Lower chest: Dependent bibasilar atelectasis is noted. No pleural or pericardial effusion. Hepatobiliary: The liver is diffusely low attenuating consistent with fatty infiltration. The liver measures 21 cm craniocaudal. No focal lesion or intrahepatic biliary ductal dilatation. The gallbladder appears normal. Pancreas: Unremarkable. No pancreatic ductal dilatation or surrounding inflammatory changes. Spleen: Normal in size without focal abnormality. Adrenals/Urinary Tract: Adrenal glands are unremarkable. Kidneys are normal, without renal calculi, focal lesion, or hydronephrosis. Bladder is unremarkable. Stomach/Bowel: Appendix: Location: Retrocecal in the right lower quadrant. Diameter: 1.7 cm Appendicolith: 2 are identified measuring 0.9 and 0.8 cm Mucosal hyper-enhancement:  Yes Extraluminal gas: Negative Periappendiceal collection: Ill-defined fluid is present about the appendix and the wall of the appendix appears markedly irregular. The stomach, small bowel and colon appear normal. Vascular/Lymphatic: No significant vascular findings are present. No enlarged abdominal or pelvic lymph nodes. Reproductive: Prostate is unremarkable. Other: None. Musculoskeletal: Negative. IMPRESSION: The study is positive for acute appendicitis with surrounding fluid most consistent rupture and phlegmon. No abscess. Hepatomegaly and fatty infiltration liver. These results were  called by telephone at the time of interpretation on 08/12/2017 at 2:52 pm to. Santiago Stenzel, P.A., Who verbally acknowledged these results. Electronically Signed   By: Drusilla Kanner M.D.   On: 08/12/2017 14:54    Procedures Procedures (including critical care time)  CRITICAL CARE- acute appendicitis with perforation Performed by: Rhona Raider   Total critical care time: 45 minutes  Critical care time was exclusive of separately billable procedures and treating other patients.  Critical care was necessary to treat or prevent imminent or life-threatening deterioration.  Critical care was time spent personally by me on the following activities: development of treatment plan with patient and/or surrogate as well as nursing, discussions with consultants, evaluation of patient's response to treatment, examination of patient, obtaining history from patient or surrogate, ordering and performing treatments and interventions, ordering and review of laboratory studies, ordering and review of radiographic studies, pulse oximetry and re-evaluation of patient's condition.   Medications Ordered in ED Medications  cefTRIAXone (ROCEPHIN) 1 g in sodium chloride 0.9 % 100 mL IVPB (has no administration in time range)  metroNIDAZOLE (FLAGYL) IVPB 500 mg (has no administration in time range)  iohexol (OMNIPAQUE) 300 MG/ML solution 100 mL (has no administration in time range)  sodium chloride 0.9 % bolus 2,000 mL (0 mLs Intravenous Stopped 08/12/17 1448)  acetaminophen (TYLENOL) tablet 1,000 mg (1,000 mg Oral Given 08/12/17 1306)  ondansetron (ZOFRAN) injection 4 mg (4 mg Intravenous Given 08/12/17 1306)  morphine 4 MG/ML injection 4 mg (4 mg Intravenous Given 08/12/17 1306)  iohexol (OMNIPAQUE) 300 MG/ML solution 100 mL (100 mLs Intravenous Contrast Given 08/12/17 1431)     Initial Impression / Assessment and Plan / ED Course  I have reviewed the triage vital signs and the nursing notes.  Pertinent  labs & imaging results that were available during my care of the patient were reviewed by me and considered in my medical decision making (see chart for details).     27 y.o. male here with RLQ pain x2 days, started out periumbilical but became localized to RLQ. Has loss of appetite, nausea, and fever. On exam, febrile to 102.3 and tachycardic in the 110-120s but not septic appearing and overall fairly well appearing, mild RUQ TTP but neg murphy's, moderate RLQ TTP at mcburney's point, with some rebound and guarding, hypoactive bowel sounds throughout. Work up thus far reveals CBC with WBC 19.6, remainder of work up pending. Will add-on lactic and differential, will await lipase, CMP, U/A, and get CT abd/pelv for suspected appendicitis. Pt already received 1g rocephin so doubt need for calling code sepsis since he's already received empiric abx, no hypotension and he doesn't appear septic so doubt need for weight based fluids; will give morphine, zofran, 2L bolus, and tylenol and then reassess shortly.   1:17 PM Differential confirms neutrophilic predominance to his leukocytosis. CMP with elevated AST 45, ALT 143 but otherwise fairly unremarkable. Lipase WNL. Lactic 1.94 which is just barely over the cutoff of 1.9; again, the pt does not appear septic, has stable BP,  and has already received empiric abx therefore will refrain from calling code sepsis; he is well perfused and does not have any evidence of severe sepsis at this time. U/A unremarkable. Awaiting CT abd/pelv. Will reassess shortly.   2:28 PM Pt not yet gone for CT. Will go ahead and order remainder of empiric abx for suspected appendicitis (1g more of rocephin, and 500mg  IV flagyl). Will continue to monitor and await CT abd/pelv.   2:59 PM CT abd/pelv with acute appendicitis with surrounding fluid c/w perforation and phlegmon, no abscess identified; also with hepatomegaly and fatty liver infiltration. Will consult surgery. Second lactic due,  will order this now. Discussed case with my attending Dr. Madilyn Hook who agrees with plan.   3:04 PM Barnetta Chapel PA-C of gen surg returning page and will admit. Holding orders to be placed by admitting team. Please see their notes for further documentation of care. I appreciate their help with this pleasant pt's care. Pt stable at time of admission.    Final Clinical Impressions(s) / ED Diagnoses   Final diagnoses:  Acute appendicitis with perforation, localized peritonitis, and gangrene, without abscess  RLQ abdominal pain  Nausea  Neutrophilic leukocytosis  Transaminitis  Fatty liver    ED Discharge Orders    [address] [place], [place] 08/12/17 1505    Tilden Fossa, MD 08/13/17 [phone]

## 2017-08-13 ENCOUNTER — Encounter (HOSPITAL_COMMUNITY): Payer: Self-pay | Admitting: General Surgery

## 2017-08-13 NOTE — Progress Notes (Signed)
1 Day Post-Op   Subjective/Chief Complaint: Doing ok pain controlled    Objective: Vital signs in last 24 hours: Temp:  [97.5 F (36.4 C)-103 F (39.4 C)] 97.5 F (36.4 C) (06/19 0440) Pulse Rate:  [82-122] 82 (06/19 0440) Resp:  [1-24] 19 (06/19 0440) BP: (113-134)/(57-82) 132/63 (06/19 0440) SpO2:  [92 %-97 %] 94 % (06/19 0440) Weight:  [117.9 kg (259 lb 14.8 oz)-117.9 kg (260 lb)] 117.9 kg (259 lb 14.8 oz) (06/18 2027) Last BM Date: 08/12/17  Intake/Output from previous day: 06/18 0701 - 06/19 0700 In: 2813.7 [P.O.:220; I.V.:2345; IV Piggyback:248.8] Out: 910 [Urine:900; Blood:10] Intake/Output this shift: No intake/output data recorded.  Incision/Wound:CDI soft sore but no peritonitis   Lab Results:  Recent Labs    08/12/17 1140  WBC 19.6*  HGB 14.9  HCT 44.7  PLT 299   BMET Recent Labs    08/12/17 1140  NA 136  K 4.1  CL 99*  CO2 25  GLUCOSE 115*  BUN 9  CREATININE 1.00  CALCIUM 9.6   PT/INR No results for input(s): LABPROT, INR in the last 72 hours. ABG No results for input(s): PHART, HCO3 in the last 72 hours.  Invalid input(s): PCO2, PO2  Studies/Results: Ct Abdomen Pelvis W Contrast  Result Date: 08/12/2017 CLINICAL DATA:  Right lower quadrant pain, nausea and fever for 3 days. Elevated white blood cell count and liver function tests. EXAM: CT ABDOMEN AND PELVIS WITH CONTRAST TECHNIQUE: Multidetector CT imaging of the abdomen and pelvis was performed using the standard protocol following bolus administration of intravenous contrast. CONTRAST:  100 ml OMNIPAQUE IOHEXOL 300 MG/ML  SOLN COMPARISON:  None. FINDINGS: Lower chest: Dependent bibasilar atelectasis is noted. No pleural or pericardial effusion. Hepatobiliary: The liver is diffusely low attenuating consistent with fatty infiltration. The liver measures 21 cm craniocaudal. No focal lesion or intrahepatic biliary ductal dilatation. The gallbladder appears normal. Pancreas: Unremarkable. No  pancreatic ductal dilatation or surrounding inflammatory changes. Spleen: Normal in size without focal abnormality. Adrenals/Urinary Tract: Adrenal glands are unremarkable. Kidneys are normal, without renal calculi, focal lesion, or hydronephrosis. Bladder is unremarkable. Stomach/Bowel: Appendix: Location: Retrocecal in the right lower quadrant. Diameter: 1.7 cm Appendicolith: 2 are identified measuring 0.9 and 0.8 cm Mucosal hyper-enhancement: Yes Extraluminal gas: Negative Periappendiceal collection: Ill-defined fluid is present about the appendix and the wall of the appendix appears markedly irregular. The stomach, small bowel and colon appear normal. Vascular/Lymphatic: No significant vascular findings are present. No enlarged abdominal or pelvic lymph nodes. Reproductive: Prostate is unremarkable. Other: None. Musculoskeletal: Negative. IMPRESSION: The study is positive for acute appendicitis with surrounding fluid most consistent rupture and phlegmon. No abscess. Hepatomegaly and fatty infiltration liver. These results were called by telephone at the time of interpretation on 08/12/2017 at 2:52 pm to. [address], P.A., Who verbally acknowledged these results. Electronically Signed   By: Drusilla Kannerhomas  Dalessio M.D.   On: 08/12/2017 14:54    Anti-infectives: Anti-infectives (From admission, onward)   Start     Dose/Rate Route Frequency Ordered Stop   08/13/17 0000  piperacillin-tazobactam (ZOSYN) IVPB 3.375 g     3.375 g 12.5 mL/hr over 240 Minutes Intravenous Every 8 hours 08/12/17 1950     08/12/17 1600  piperacillin-tazobactam (ZOSYN) IVPB 3.375 g  Status:  Discontinued     3.375 g 12.5 mL/hr over 240 Minutes Intravenous Every 8 hours 08/12/17 1550 08/12/17 2018   08/12/17 1430  cefTRIAXone (ROCEPHIN) 1 g in sodium chloride 0.9 % 100 mL IVPB  1 g 200 mL/hr over 30 Minutes Intravenous  Once 08/12/17 1428 08/12/17 1556   08/12/17 1430  metroNIDAZOLE (FLAGYL) IVPB 500 mg  Status:  Discontinued      500 mg 100 mL/hr over 60 Minutes Intravenous  Once 08/12/17 1428 08/12/17 1557      Assessment/Plan: s/p Procedure(s): APPENDECTOMY LAPAROSCOPIC (N/A) Advance diet Plan for discharge tomorrow  LOS: 1 day    Ryan Richardson A Artis Buechele 08/13/2017

## 2017-08-14 MED ORDER — AMOXICILLIN-POT CLAVULANATE 875-125 MG PO TABS: 1.0000 | ORAL_TABLET | Freq: Two times a day (BID) | ORAL | 0 refills | Status: DC

## 2017-08-14 MED ORDER — OXYCODONE HCL 5 MG PO TABS: 5.0000 mg | ORAL_TABLET | Freq: Four times a day (QID) | ORAL | 0 refills | Status: AC | PRN

## 2017-08-14 MED ORDER — IBUPROFEN 800 MG PO TABS: 800.0000 mg | ORAL_TABLET | Freq: Three times a day (TID) | ORAL | 0 refills | Status: AC | PRN

## 2017-08-14 NOTE — Discharge Instructions (Signed)
Apendicectoma abierta, cuidados posteriores (Open Appendectomy, Care After) Siga estas instrucciones durante las prximas semanas. Estas indicaciones le proporcionan informacin acerca de cmo deber cuidarse despus del procedimiento. El mdico tambin podr darle instrucciones ms especficas. El tratamiento ha sido planificado segn las prcticas mdicas actuales, pero en algunos casos pueden ocurrir problemas. Comunquese con el mdico si tiene algn problema o dudas despus del procedimiento. QU ESPERAR DESPUS DEL PROCEDIMIENTO Despus del procedimiento, es comn DIRECTVtener los siguientes sntomas:  Disminucin en el nivel de [place]energa.  Dolor leve en la zona donde se realiz el corte quirrgico (incisin).  Estreimiento. Puede ser a causa de los analgsicos y la disminucin de sus [place]actividades. INSTRUCCIONES PARA EL CUIDADO EN EL HOGAR Medicamentos  Baxter Internationalome los medicamentos de venta libre y los recetados solamente como se lo haya indicado el mdico.  No conduzca durante 24horas si le administraron un sedante.  No conduzca ni opere maquinaria pesada mientras toma analgsicos recetados.  Si le recetaron un antibitico, tmelo como se lo haya indicado el mdico. No deje de tomar los antibiticos aunque comience a Actorsentirse mejor. Actividad  Reanude sus actividades regulares de forma gradual.  Siga las siguientes indicaciones durante 3 semanas o el tiempo que le haya indicado el mdico: ? No levante ningn objeto que pese ms de 10libras (4,5kg). ? No practique deportes de contacto. El bao  Mantenga la incisin limpia y Urban[place]ca. ? Lave la incisin suavemente con agua y [place]jabn. ? Enjuguela con agua para quitar todo el jabn. ? Squela bien con una toalla limpia dando golpecitos. No frote sobre la incisin.  Puede tomar una ducha despus de 48horas.  No tome baos de inmersin, no practique natacin ni use el jacuzzi durante 2semanas o durante el tiempo que le indique su  mdico. Cuidado de la incisin  Siga las indicaciones del mdico acerca del cuidado de la incisin. Haga lo siguiente: ? Lvese las manos con agua y jabn antes de Multimedia programmercambiar las vendas (vendaje). Use desinfectante para manos si no dispone de [place] y [place]jabn. ? Cambie el vendaje como se lo haya indicado el mdico. ? No retire los puntos (suturas), el QUALCOMMadhesivo para la piel o las tiras Maumelleadhesivas. En algunos casos, es posible que estos deban quedar puestos en la piel durante 2semanas o ms tiempo. Si los bordes de las tiras [address] Rdadhesivas empiezan a despegarse y Scientific laboratory technicianenroscarse, puede recortar los que estn sueltos. No retire las tiras Agilent Technologiesadhesivas por completo a menos que el mdico se lo indique.  Controle todos los das la zona de la incisin para detectar signos de infeccin. Controle lo siguiente: ? Aumento del enrojecimiento, la hinchazn o Chief Technology Officerel dolor. ? Ms lquido [place] Janskyo sangre. ? Calor. ? Pus o mal olor. Otras indicaciones  Si lo enviaron de regreso a su casa con un drenaje, siga las instrucciones del mdico sobre cmo cuidarlo y vaciarlo.  Haga respiraciones profundas. Esto ayuda a prevenir que se inflamen los pulmones.  Lyda PeronePara aliviar y prevenir el estreimiento: ? Beba abundantes lquidos. ? Coma mucha fruta y verdura fresca.  Concurra a todas las visitas de control como se lo haya indicado el mdico. Esto es importante. SOLICITE ATENCIN MDICA SI:  Tiene ms enrojecimiento, hinchazn o dolor en el lugar de la incisin.  Le sale ms lquido o sangre de la incisin.  La incisin est caliente al tacto.  Tiene secrecin de pus o percibe un olor ftido que emanan de la incisin o del vendaje.  Se le abren los bordes de la incisin despus de  que le Group 1 Automotivehayan sacado las suturas.  Siente ms dolor en los hombros.  Se siente mareado o se desmaya.  Le falta el aire.  Sigue teniendo nuseas o vomitando.  Tiene diarrea o no pierde el control de las funciones intestinales.  Pierde el  apetito.  Presenta dolor o hinchazn en las piernas. SOLICITE ATENCIN MDICA DE INMEDIATO SI:  Tiene fiebre.  Le aparece una erupcin cutnea.  Tiene dificultad para respirar.  Siente dolores fuertes Avayaen el pecho. Esta informacin no tiene Theme park managercomo fin reemplazar el consejo del mdico. Asegrese de hacerle al mdico cualquier pregunta que tenga. Document Released: 10/10/2010 Document Revised: 06/05/2015 Document Reviewed: 08/01/2014 Elsevier Interactive Patient Education  2018 [employer]

## 2017-08-14 NOTE — Progress Notes (Signed)
2 Days Post-Op   Subjective/Chief Complaint: Doing well good pain control tolerating diet    Objective: Vital signs in last 24 hours: Temp:  [98.5 F (36.9 C)-99.1 F (37.3 C)] 98.6 F (37 C) (06/20 0529) Pulse Rate:  [84-104] 84 (06/20 0529) Resp:  [18-19] 18 (06/20 0529) BP: (113-136)/(61-81) 113/61 (06/20 0529) SpO2:  [95 %] 95 % (06/20 0529) Last BM Date: 08/12/17  Intake/Output from previous day: No intake/output data recorded. Intake/Output this shift: No intake/output data recorded.  Incision/Wound:CDI soft sore   Lab Results:  Recent Labs    08/12/17 1140  WBC 19.6*  HGB 14.9  HCT 44.7  PLT 299   BMET Recent Labs    08/12/17 1140  NA 136  K 4.1  CL 99*  CO2 25  GLUCOSE 115*  BUN 9  CREATININE 1.00  CALCIUM 9.6   PT/INR No results for input(s): LABPROT, INR in the last 72 hours. ABG No results for input(s): PHART, HCO3 in the last 72 hours.  Invalid input(s): PCO2, PO2  Studies/Results: Ct Abdomen Pelvis W Contrast  Result Date: 08/12/2017 CLINICAL DATA:  Right lower quadrant pain, nausea and fever for 3 days. Elevated white blood cell count and liver function tests. EXAM: CT ABDOMEN AND PELVIS WITH CONTRAST TECHNIQUE: Multidetector CT imaging of the abdomen and pelvis was performed using the standard protocol following bolus administration of intravenous contrast. CONTRAST:  100 ml OMNIPAQUE IOHEXOL 300 MG/ML  SOLN COMPARISON:  None. FINDINGS: Lower chest: Dependent bibasilar atelectasis is noted. No pleural or pericardial effusion. Hepatobiliary: The liver is diffusely low attenuating consistent with fatty infiltration. The liver measures 21 cm craniocaudal. No focal lesion or intrahepatic biliary ductal dilatation. The gallbladder appears normal. Pancreas: Unremarkable. No pancreatic ductal dilatation or surrounding inflammatory changes. Spleen: Normal in size without focal abnormality. Adrenals/Urinary Tract: Adrenal glands are unremarkable. Kidneys  are normal, without renal calculi, focal lesion, or hydronephrosis. Bladder is unremarkable. Stomach/Bowel: Appendix: Location: Retrocecal in the right lower quadrant. Diameter: 1.7 cm Appendicolith: 2 are identified measuring 0.9 and 0.8 cm Mucosal hyper-enhancement: Yes Extraluminal gas: Negative Periappendiceal collection: Ill-defined fluid is present about the appendix and the wall of the appendix appears markedly irregular. The stomach, small bowel and colon appear normal. Vascular/Lymphatic: No significant vascular findings are present. No enlarged abdominal or pelvic lymph nodes. Reproductive: Prostate is unremarkable. Other: None. Musculoskeletal: Negative. IMPRESSION: The study is positive for acute appendicitis with surrounding fluid most consistent rupture and phlegmon. No abscess. Hepatomegaly and fatty infiltration liver. These results were called by telephone at the time of interpretation on 08/12/2017 at 2:52 pm to. [facility] Who verbally acknowledged these results. Electronically Signed   By: Drusilla Kanner M.D.   On: 08/12/2017 14:54    Anti-infectives: Anti-infectives (From admission, onward)   Start     Dose/Rate Route Frequency Ordered Stop   08/13/17 0000  piperacillin-tazobactam (ZOSYN) IVPB 3.375 g     3.375 g 12.5 mL/hr over 240 Minutes Intravenous Every 8 hours 08/12/17 1950     08/12/17 1600  piperacillin-tazobactam (ZOSYN) IVPB 3.375 g  Status:  Discontinued     3.375 g 12.5 mL/hr over 240 Minutes Intravenous Every 8 hours 08/12/17 1550 08/12/17 2018   08/12/17 1430  cefTRIAXone (ROCEPHIN) 1 g in sodium chloride 0.9 % 100 mL IVPB     1 g 200 mL/hr over 30 Minutes Intravenous  Once 08/12/17 1428 08/12/17 1556   08/12/17 1430  metroNIDAZOLE (FLAGYL) IVPB 500 mg  Status:  Discontinued  500 mg 100 mL/hr over 60 Minutes Intravenous  Once 08/12/17 1428 08/12/17 1557      Assessment/Plan: s/p Procedure(s): APPENDECTOMY LAPAROSCOPIC (N/A) Discharge  LOS: 2  days    Ryan Richardson A [name] 08/14/2017

## 2017-08-14 NOTE — Progress Notes (Addendum)
Patient discharged to home. Verbalizes understanding of all discharge instructions including incision care, discharge medications, and follow up MD visits. Spanish interpreter used for discharge teaching.   Paged MD for work note, patient is requesting.  [phone]- Paged MD for work note.

## 2017-08-14 NOTE — Care Management Note (Signed)
Case Management Note  Patient Details  Name: Ryan Richardson MRN: [id number] Date of Birth: 09/03/1990  Subjective/Objective:                    Action/Plan:   Expected Discharge Date:  08/14/17               Expected Discharge Plan:  Home/Self Care  In-House Referral:  Financial Counselor  Discharge planning Services  CM Consult, Medication Assistance, [facility]igent [facility], [facility]  Post Acute Care Choice:  NA Choice offered to:  Patient  DME Arranged:  N/A DME Agency:  NA  HH Arranged:  NA HH Agency:  NA  Status of Service:  Completed, signed off  If discussed at Long Length of Stay Meetings, dates discussed:    Additional Comments:  [name], [name], RN 08/14/2017, 8:15 AM

## 2017-08-14 NOTE — Discharge Summary (Signed)
Physician Discharge Summary  Patient ID: Ryan Richardson MRN: [id number] DOB/AGE: 1990/10/18 27 y.o.  Admit date: 08/12/2017 Discharge date: 08/14/2017  Admission Diagnoses: Perforated appendicitis  Discharge Diagnoses:  Active Problems:   Acute perforated appendicitis   Discharged Condition: good  Hospital Course: Patient underwent laparoscopic appendectomy.  He did well.  He did have evidence of perforation but minimal contamination and no significant abscess cavity.  His postoperative course was unremarkable.  He remained afebrile.  His vital signs are stable.  His diet was advanced and he tolerated this well.  He was able to ambulate without difficulty.  His incisions are clean dry and intact.  He was tolerating his diet, had good pain control and was discharged on postoperative day 2.  Instructions were given in Spanish.    Significant Diagnostic Studies: labs:  CBC    Component Value Date/Time   WBC 19.6 (H) 08/12/2017 1140   RBC 4.96 08/12/2017 1140   HGB 14.9 08/12/2017 1140   HCT 44.7 08/12/2017 1140   PLT 299 08/12/2017 1140   MCV 90.1 08/12/2017 1140   MCH 30.0 08/12/2017 1140   MCHC 33.3 08/12/2017 1140   RDW 13.3 08/12/2017 1140   LYMPHSABS 2.4 08/12/2017 1140   MONOABS 2.3 (H) 08/12/2017 1140   EOSABS 0.0 08/12/2017 1140   BASOSABS 0.1 08/12/2017 1140    Treatments: surgery: Laparoscopic appendectomy  Discharge Exam: Blood pressure 113/61, pulse 84, temperature 98.6 F (37 C), temperature source Oral, resp. rate 18, height 5\' 6"  (1.676 m), weight 117.9 kg (259 lb 14.8 oz), SpO2 95 %. General appearance: alert and cooperative Head: Normocephalic, without obvious abnormality, atraumatic Resp: clear to auscultation bilaterally Cardio: regular rate and rhythm, S1, S2 normal, no murmur, click, rub or gallop Incision/Wound: Abdomen soft.  Port sites clean dry and intact.  No rebound or guarding.  Disposition: Discharge disposition: 01-Home or Self  Care       Discharge Instructions    Diet - low sodium heart healthy   Complete by:  As directed    Increase activity slowly   Complete by:  As directed      Allergies as of 08/14/2017   No Known Allergies     Medication List    TAKE these medications   amoxicillin-clavulanate 875-125 MG tablet Commonly known as:  AUGMENTIN Take 1 tablet by mouth 2 (two) times daily. Take for two weeks   ibuprofen 800 MG tablet Commonly known as:  ADVIL,MOTRIN Take 1 tablet (800 mg total) by mouth every 8 (eight) hours as needed.   oxyCODONE 5 MG immediate release tablet Commonly known as:  Oxy IR/ROXICODONE Take 1 tablet (5 mg total) by mouth every 6 (six) hours as needed for severe pain.      Follow-up Information    [facility] [place] [place] in 2 week(s).   Specialty:  General Surgery Contact information: [address] [place] [id number] [phone]          Signed: Clovis Puhomas A Elvenia Godden 08/14/2017, 8:06 AM

## 2017-08-17 LAB — CULTURE, BLOOD (ROUTINE X 2)
Culture: NO GROWTH
Culture: NO GROWTH
SPECIAL REQUESTS: ADEQUATE
Special Requests: ADEQUATE

## 2017-08-24 ENCOUNTER — Inpatient Hospital Stay (HOSPITAL_COMMUNITY)
Admission: EM | Admit: 2017-08-24 | Discharge: 2017-08-27 | DRG: 372 | Disposition: A | Discharge status: Home / Self Care | Payer: Self-pay | Attending: General Surgery | Admitting: General Surgery

## 2017-08-24 ENCOUNTER — Encounter (HOSPITAL_COMMUNITY): Payer: Self-pay | Admitting: Emergency Medicine

## 2017-08-24 ENCOUNTER — Emergency Department (HOSPITAL_COMMUNITY): Payer: Self-pay

## 2017-08-24 DIAGNOSIS — R21 Rash and other nonspecific skin eruption: Secondary | ICD-10-CM | POA: Diagnosis not present

## 2017-08-24 DIAGNOSIS — R7303 Prediabetes: Secondary | ICD-10-CM | POA: Diagnosis present

## 2017-08-24 DIAGNOSIS — Z6841 Body Mass Index (BMI) 40.0 and over, adult: Secondary | ICD-10-CM

## 2017-08-24 DIAGNOSIS — E669 Obesity, unspecified: Secondary | ICD-10-CM | POA: Diagnosis present

## 2017-08-24 DIAGNOSIS — K659 Peritonitis, unspecified: Principal | ICD-10-CM | POA: Diagnosis present

## 2017-08-24 DIAGNOSIS — G8918 Other acute postprocedural pain: Secondary | ICD-10-CM

## 2017-08-24 DIAGNOSIS — R509 Fever, unspecified: Secondary | ICD-10-CM

## 2017-08-24 LAB — COMPREHENSIVE METABOLIC PANEL
ALBUMIN: 3.5 g/dL (ref 3.5–5.0)
ALT: 71 U/L — ABNORMAL HIGH (ref 0–44)
AST: 24 U/L (ref 15–41)
Alkaline Phosphatase: 56 U/L (ref 38–126)
Anion gap: 9 (ref 5–15)
BUN: 11 mg/dL (ref 6–20)
CALCIUM: 9.2 mg/dL (ref 8.9–10.3)
CHLORIDE: 101 mmol/L (ref 98–111)
CO2: 25 mmol/L (ref 22–32)
Creatinine, Ser: 0.92 mg/dL (ref 0.61–1.24)
GFR calc Af Amer: >60 mL/min (ref >60)
GFR calc non Af Amer: >60 mL/min (ref >60)
GLUCOSE: 118 mg/dL — AB (ref 70–99)
Potassium: 3.9 mmol/L (ref 3.5–5.1)
SODIUM: 135 mmol/L (ref 135–145)
Total Bilirubin: 1.6 mg/dL — ABNORMAL HIGH (ref 0.3–1.2)
Total Protein: 8.7 g/dL — ABNORMAL HIGH (ref 6.5–8.1)

## 2017-08-24 LAB — CBC
HCT: 46.4 % (ref 39.0–52.0)
Hemoglobin: 15.3 g/dL (ref 13.0–17.0)
MCH: 29.6 pg (ref 26.0–34.0)
MCHC: 33 g/dL (ref 30.0–36.0)
MCV: 89.7 fL (ref 78.0–100.0)
Platelets: 292 10*3/uL (ref 150–400)
RBC: 5.17 MIL/uL (ref 4.22–5.81)
RDW: 13 % (ref 11.5–15.5)
WBC: 18.1 10*3/uL — ABNORMAL HIGH (ref 4.0–10.5)

## 2017-08-24 LAB — URINALYSIS, ROUTINE W REFLEX MICROSCOPIC
Bacteria, UA: NONE SEEN
Bilirubin Urine: NEGATIVE
GLUCOSE, UA: NEGATIVE mg/dL
Hgb urine dipstick: NEGATIVE
KETONES UR: NEGATIVE mg/dL
Leukocytes, UA: NEGATIVE
Nitrite: NEGATIVE
PROTEIN: 30 mg/dL — AB
Specific Gravity, Urine: 1.028 (ref 1.005–1.030)
pH: 6 (ref 5.0–8.0)

## 2017-08-24 LAB — LIPASE, BLOOD: LIPASE: 29 U/L (ref 11–51)

## 2017-08-24 MED ORDER — SODIUM CHLORIDE 0.9 % IV BOLUS
1000.0000 mL | Freq: Once | INTRAVENOUS | Status: AC
  Administered 2017-08-24: 1000 mL via INTRAVENOUS

## 2017-08-24 MED ORDER — PIPERACILLIN-TAZOBACTAM 3.375 G IVPB 30 MIN
3.3750 g | Freq: Once | INTRAVENOUS | Status: AC
  Administered 2017-08-24: 3.375 g via INTRAVENOUS
  Filled 2017-08-24: qty 50

## 2017-08-24 MED ORDER — ENOXAPARIN SODIUM 40 MG/0.4ML ~~LOC~~ SOLN
40.0000 mg | SUBCUTANEOUS | Status: DC
  Administered 2017-08-24 – 2017-08-26 (×3): 40 mg via SUBCUTANEOUS
  Filled 2017-08-24 (×3): qty 0.4

## 2017-08-24 MED ORDER — ONDANSETRON 4 MG PO TBDP: 4.0000 mg | ORAL_TABLET | Freq: Four times a day (QID) | ORAL | Status: DC | PRN

## 2017-08-24 MED ORDER — KCL IN DEXTROSE-NACL 20-5-0.45 MEQ/L-%-% IV SOLN: INTRAVENOUS | Status: DC

## 2017-08-24 MED ORDER — SODIUM CHLORIDE 0.9 % IV SOLN
1000.0000 mL | INTRAVENOUS | Status: DC
  Administered 2017-08-24: 1000 mL via INTRAVENOUS

## 2017-08-24 MED ORDER — FAMOTIDINE IN NACL 20-0.9 MG/50ML-% IV SOLN
20.0000 mg | Freq: Once | INTRAVENOUS | Status: AC
  Administered 2017-08-24: 20 mg via INTRAVENOUS
  Filled 2017-08-24: qty 50

## 2017-08-24 MED ORDER — ZOLPIDEM TARTRATE 5 MG PO TABS: 5.0000 mg | ORAL_TABLET | Freq: Every evening | ORAL | Status: DC | PRN

## 2017-08-24 MED ORDER — PANTOPRAZOLE SODIUM 40 MG PO TBEC
40.0000 mg | DELAYED_RELEASE_TABLET | Freq: Every day | ORAL | Status: DC
  Administered 2017-08-24 – 2017-08-26 (×3): 40 mg via ORAL
  Filled 2017-08-24 (×3): qty 1

## 2017-08-24 MED ORDER — SODIUM CHLORIDE 0.9 % IV BOLUS (SEPSIS)
1000.0000 mL | Freq: Once | INTRAVENOUS | Status: AC
  Administered 2017-08-24: 1000 mL via INTRAVENOUS

## 2017-08-24 MED ORDER — IOHEXOL 300 MG/ML  SOLN
100.0000 mL | Freq: Once | INTRAMUSCULAR | Status: AC | PRN
  Administered 2017-08-24: 100 mL via INTRAVENOUS

## 2017-08-24 MED ORDER — ONDANSETRON HCL 4 MG/2ML IJ SOLN: 4.0000 mg | Freq: Four times a day (QID) | INTRAMUSCULAR | Status: DC | PRN

## 2017-08-24 MED ORDER — FLEET ENEMA 7-19 GM/118ML RE ENEM: 1.0000 | ENEMA | Freq: Once | RECTAL | Status: DC | PRN

## 2017-08-24 MED ORDER — METRONIDAZOLE IN NACL 5-0.79 MG/ML-% IV SOLN
500.0000 mg | Freq: Three times a day (TID) | INTRAVENOUS | Status: DC
  Administered 2017-08-24 – 2017-08-27 (×8): 500 mg via INTRAVENOUS
  Filled 2017-08-24 (×9): qty 100

## 2017-08-24 MED ORDER — PROCHLORPERAZINE MALEATE 5 MG PO TABS
10.0000 mg | ORAL_TABLET | Freq: Four times a day (QID) | ORAL | Status: DC | PRN
  Filled 2017-08-24: qty 2

## 2017-08-24 MED ORDER — METHYLPREDNISOLONE SODIUM SUCC 125 MG IJ SOLR
125.0000 mg | Freq: Once | INTRAMUSCULAR | Status: AC
  Administered 2017-08-24: 125 mg via INTRAVENOUS
  Filled 2017-08-24: qty 2

## 2017-08-24 MED ORDER — PIPERACILLIN-TAZOBACTAM 3.375 G IVPB: 3.3750 g | Freq: Three times a day (TID) | INTRAVENOUS | Status: DC

## 2017-08-24 MED ORDER — CIPROFLOXACIN IN D5W 400 MG/200ML IV SOLN
400.0000 mg | Freq: Two times a day (BID) | INTRAVENOUS | Status: DC
  Administered 2017-08-24 – 2017-08-26 (×5): 400 mg via INTRAVENOUS
  Filled 2017-08-24 (×7): qty 200

## 2017-08-24 MED ORDER — HYDROMORPHONE HCL 1 MG/ML IJ SOLN: 0.5000 mg | INTRAMUSCULAR | Status: DC | PRN

## 2017-08-24 MED ORDER — EPINEPHRINE 0.3 MG/0.3ML IJ SOAJ
0.3000 mg | Freq: Once | INTRAMUSCULAR | Status: AC
  Administered 2017-08-24: 0.3 mg via INTRAMUSCULAR

## 2017-08-24 MED ORDER — ONDANSETRON 4 MG PO TBDP
4.0000 mg | ORAL_TABLET | Freq: Once | ORAL | Status: AC | PRN
  Administered 2017-08-24: 4 mg via ORAL
  Filled 2017-08-24: qty 1

## 2017-08-24 MED ORDER — POTASSIUM CHLORIDE IN NACL 20-0.9 MEQ/L-% IV SOLN
INTRAVENOUS | Status: DC
  Administered 2017-08-24 – 2017-08-26 (×2): via INTRAVENOUS
  Filled 2017-08-24 (×3): qty 1000

## 2017-08-24 MED ORDER — DIPHENHYDRAMINE HCL 50 MG/ML IJ SOLN
INTRAMUSCULAR | Status: AC
  Administered 2017-08-24: 50 mg
  Filled 2017-08-24: qty 1

## 2017-08-24 MED ORDER — POLYETHYLENE GLYCOL 3350 17 G PO PACK: 17.0000 g | PACK | Freq: Every day | ORAL | Status: DC | PRN

## 2017-08-24 MED ORDER — EPINEPHRINE 0.3 MG/0.3ML IJ SOAJ
INTRAMUSCULAR | Status: AC
  Administered 2017-08-24: 0.3 mg via INTRAMUSCULAR
  Filled 2017-08-24: qty 0.3

## 2017-08-24 MED ORDER — PROCHLORPERAZINE EDISYLATE 10 MG/2ML IJ SOLN: 5.0000 mg | Freq: Four times a day (QID) | INTRAMUSCULAR | Status: DC | PRN

## 2017-08-24 MED ORDER — OXYCODONE HCL 5 MG PO TABS: 5.0000 mg | ORAL_TABLET | Freq: Four times a day (QID) | ORAL | Status: DC | PRN

## 2017-08-24 MED ORDER — DIPHENHYDRAMINE HCL 50 MG/ML IJ SOLN: 25.0000 mg | Freq: Once | INTRAMUSCULAR | Status: DC

## 2017-08-24 NOTE — ED Provider Notes (Signed)
[facility] Provider Note   CSN: [id number] Arrival date & time: 08/24/17  1226    Spanish language translator used History   Chief Complaint Chief Complaint  Patient presents with  . Constipation  . Fever    HPI Ryan Richardson is a 27 y.o. male.  HPI Patient presents to the emergency room for evaluation of nausea, vomiting, abdominal pain, fever, and cough.  Patient has a history of a laparoscopic appendectomy on June 18.  Patient states he was doing well and was not having any abdominal pain.  Patient states this past Friday he started having trouble with pain in his right lower abdomen.  He does not feel that it is near his surgical site but more internal.  He also has been coughing.  Today he measured a temperature up to 102 so he came to the emergency room for evaluation Past Medical History:  Diagnosis Date  . Pre-diabetes     Patient Active Problem List   Diagnosis Date Noted  . Acute perforated appendicitis 08/12/2017    Past Surgical History:  Procedure Laterality Date  . APPENDECTOMY  08/12/2017  . LAPAROSCOPIC APPENDECTOMY N/A 08/12/2017   Procedure: APPENDECTOMY LAPAROSCOPIC;  Surgeon: Axel Filleramirez, Armando, MD;  [facility];  Service: General;  Laterality: N/A;        Home Medications    Prior to Admission medications   Medication Sig Start Date End Date Taking? Authorizing Provider  amoxicillin-clavulanate (AUGMENTIN) 875-125 MG tablet Take 1 tablet by mouth 2 (two) times daily. Take for two weeks 08/14/17   Harriette Bouillonornett, Thomas, MD  ibuprofen (ADVIL,MOTRIN) 800 MG tablet Take 1 tablet (800 mg total) by mouth every 8 (eight) hours as needed. 08/14/17   Cornett, Maisie Fushomas, MD  oxyCODONE (OXY IR/ROXICODONE) 5 MG immediate release tablet Take 1 tablet (5 mg total) by mouth every 6 (six) hours as needed for severe pain. 08/14/17   Harriette Bouillonornett, Thomas, MD    Family History No family history on file.  Social History Social History    Tobacco Use  . Smoking status: Never Smoker  . Smokeless tobacco: Never Used  Substance Use Topics  . Alcohol use: Yes    Alcohol/week: 7.2 oz    Types: 12 Cans of beer per week    Comment: 08/12/2017 "drink only on the weekends"  . Drug use: Not Currently     Allergies   Patient has no known allergies.   Review of Systems Review of Systems  All other systems reviewed and are negative.    Physical Exam Updated Vital Signs BP 138/88 (BP Location: Right Arm)   Pulse (!) 105   Temp 100 F (37.8 C) (Oral)   Resp 18   SpO2 96%   Physical Exam  Constitutional: He appears well-developed and well-nourished. No distress.  HENT:  Head: Normocephalic and atraumatic.  Right Ear: External ear normal.  Left Ear: External ear normal.  Eyes: Conjunctivae are normal. Right eye exhibits no discharge. Left eye exhibits no discharge. No scleral icterus.  Neck: Neck supple. No tracheal deviation present.  Cardiovascular: Normal rate, regular rhythm and intact distal pulses.  Pulmonary/Chest: Effort normal and breath sounds normal. No stridor. No respiratory distress. He has no wheezes. He has no rales.  Frequently coughing  Abdominal: Soft. Bowel sounds are normal. He exhibits no distension. There is tenderness ( Mild in the right lower quadrant). There is no rebound and no guarding.  Musculoskeletal: He exhibits no edema or tenderness.  Neurological: He  is alert. He has normal strength. No cranial nerve deficit (no facial droop, extraocular movements intact, no slurred speech) or sensory deficit. He exhibits normal muscle tone. He displays no seizure activity. Coordination normal.  Skin: Skin is warm and dry. No rash noted. He is not diaphoretic.  Psychiatric: He has a normal mood and affect.  Nursing note and vitals reviewed.    ED Treatments / Results  Labs (all labs ordered are listed, but only abnormal results are displayed) Labs Reviewed  COMPREHENSIVE METABOLIC PANEL -  Abnormal; Notable for the following components:      Result Value   Glucose, Bld 118 (*)    Total Protein 8.7 (*)    ALT 71 (*)    Total Bilirubin 1.6 (*)    All other components within normal limits  CBC - Abnormal; Notable for the following components:   WBC 18.1 (*)    All other components within normal limits  URINALYSIS, ROUTINE W REFLEX MICROSCOPIC - Abnormal; Notable for the following components:   Color, Urine AMBER (*)    Protein, ur 30 (*)    All other components within normal limits  LIPASE, BLOOD    EKG None  Radiology No results found.  Procedures Procedures (including critical care time)  Medications Ordered in ED Medications  ondansetron (ZOFRAN-ODT) disintegrating tablet 4 mg (has no administration in time range)  sodium chloride 0.9 % bolus 1,000 mL (has no administration in time range)     Initial Impression / Assessment and Plan / ED Course  I have reviewed the triage vital signs and the nursing notes.  Pertinent labs & imaging results that were available during my care of the patient were reviewed by me and considered in my medical decision making (see chart for details).  Clinical Course as of Aug 24 1556  Sun Aug 24, 2017  1450 Previous records reviewed.  Patient did have a perforated appendicitis but there was minimal contamination and no significant abscess   [JK]  1557 Will ct to evaluate for possible abscess.  Fever and WBC.  No pna on cxr   [JK]    Clinical Course User Index [JK] Linwood Dibbles, MD    Patient presented to the emergency room for evaluation of fever and lower abdominal pain.  He recently had an appendectomy.  There was evidence of perforation on his appendectomy.  It is possible that he may have developed an abscess.  I will order a CT scan to evaluate further.  Case turned over to oncoming MD  Final Clinical Impressions(s) / ED Diagnoses  pending   Linwood Dibbles, MD 08/26/17 0700

## 2017-08-24 NOTE — ED Notes (Signed)
Attempted report 

## 2017-08-24 NOTE — Progress Notes (Signed)
Received pt alert and oriented. Pt ambulated to bed. No complains of pain. Oriented pt with room, diet and use of call light. Stratus translator video used. Will monitor pt.

## 2017-08-24 NOTE — ED Notes (Signed)
Pt's family called out, pt appeared to be having an allergic reaction to the Zosyn.  Pt's face was red and slightly swollen, rash on chest and neck.  Pt stated he was not having trouble breathing however he looked very uncomfortable.  Provider notified and bedside, gave ordered medications, will continue to monitor.  Medication had completed before reaction was observed.

## 2017-08-24 NOTE — ED Notes (Signed)
Patient transported to CT 

## 2017-08-24 NOTE — ED Triage Notes (Signed)
Patient to ED c/o constipation, N/V (emesis x 1 this morning), fevers (max 102F at home), loss of appetite, and new, non-productive cough x 2 days. Patient had laparoscopic appendectomy on 6/18 and reports persistent RLQ pain. Patient states he's been straining and noticed blood when wiping, last BM yesterday.

## 2017-08-24 NOTE — H&P (Addendum)
Ryan Richardson is an 27 y.o. male.   Chief Complaint: Abdominal pain and fevers HPI: The patient is status post laparoscopic appendectomy 12 days ago.  Did well initially, but started having fevers and chills Friday associated with increasing abdominal pain.  Appetite remained normal, but the patient did have some nausea and vomiting todaty prior to coming to the ED.  CT done after admission demonstrates pericecal inflammatory changes, but no distinct abscess or obvious perforation.  He is being addmitted for IV antibiotics and hydration.  Sepsis protocol has been initiated by EDP.  Past Medical History:  Diagnosis Date  . Pre-diabetes     Past Surgical History:  Procedure Laterality Date  . APPENDECTOMY  08/12/2017  . LAPAROSCOPIC APPENDECTOMY N/A 08/12/2017   Procedure: APPENDECTOMY LAPAROSCOPIC;  Surgeon: Ralene Ok, MD;  Location: [facility];  Service: General;  Laterality: N/A;    No family history on file. Social History:  reports that he has never smoked. He has never used smokeless tobacco. He reports that he drinks about 7.2 oz of alcohol per week. He reports that he has current or past drug history.  Allergies: No Known Allergies   (Not in a hospital admission)  Results for orders placed or performed during the hospital encounter of 08/24/17 (from the past 48 hour(s))  Lipase, blood     Status: None   Collection Time: 08/24/17  1:20 PM  Result Value Ref Range   Lipase 29 11 - 51 U/L    Comment: Performed at [facility], [address], [place], [place] [address]  Comprehensive metabolic panel     Status: Abnormal   Collection Time: 08/24/17  1:20 PM  Result Value Ref Range   Sodium 135 135 - 145 mmol/L   Potassium 3.9 3.5 - 5.1 mmol/L   Chloride 101 98 - 111 mmol/L    Comment: Please note change in reference range.   CO2 25 22 - 32 mmol/L   Glucose, Bld 118 (H) 70 - 99 mg/dL    Comment: Please note change in reference range.   BUN 11 6 - 20 mg/dL    Comment:  Please note change in reference range.   Creatinine, Ser 0.92 0.61 - 1.24 mg/dL   Calcium 9.2 8.9 - 10.3 mg/dL   Total Protein 8.7 (H) 6.5 - 8.1 g/dL   Albumin 3.5 3.5 - 5.0 g/dL   AST 24 15 - 41 U/L   ALT 71 (H) 0 - 44 U/L    Comment: Please note change in reference range.   Alkaline Phosphatase 56 38 - 126 U/L   Total Bilirubin 1.6 (H) 0.3 - 1.2 mg/dL   GFR calc non Af Amer >60 >60 mL/min   GFR calc Af Amer >60 >60 mL/min    Comment: (NOTE) The eGFR has been calculated using the CKD EPI equation. This calculation has not been validated in all clinical situations. eGFR's persistently <60 mL/min signify possible Chronic Kidney Disease.    Anion gap 9 5 - 15    Comment: Performed at [facility] [address] [place] [address]  CBC     Status: Abnormal   Collection Time: 08/24/17  1:20 PM  Result Value Ref Range   WBC 18.1 (H) 4.0 - 10.5 K/uL   RBC 5.17 4.22 - 5.81 MIL/uL   Hemoglobin 15.3 13.0 - 17.0 g/dL   HCT 46.4 39.0 - 52.0 %   MCV 89.7 78.0 - 100.0 fL   MCH 29.6  26.0 - 34.0 pg   MCHC 33.0 30.0 - 36.0 g/dL   RDW 13.0 11.5 - 15.5 %   Platelets 292 150 - 400 K/uL    Comment: Performed at [facility], [place] [address] [place], [place] [address]  Urinalysis, Routine w reflex microscopic     Status: Abnormal   Collection Time: 08/24/17  1:26 PM  Result Value Ref Range   Color, Urine AMBER (A) YELLOW    Comment: BIOCHEMICALS MAY BE AFFECTED BY COLOR   APPearance CLEAR CLEAR   Specific Gravity, Urine 1.028 1.005 - 1.030   pH 6.0 5.0 - 8.0   Glucose, UA NEGATIVE NEGATIVE mg/dL   Hgb urine dipstick NEGATIVE NEGATIVE   Bilirubin Urine NEGATIVE NEGATIVE   Ketones, ur NEGATIVE NEGATIVE mg/dL   Protein, ur 30 (A) NEGATIVE mg/dL   Nitrite NEGATIVE NEGATIVE   Leukocytes, UA NEGATIVE NEGATIVE   RBC / HPF 0-5 0 - 5 RBC/hpf   WBC, UA 0-5 0 - 5 WBC/hpf   Bacteria, UA NONE SEEN NONE SEEN   Squamous Epithelial / LPF 0-5 0 - 5   Mucus PRESENT     Comment:  Performed at [facility], [place] [address], [place], [place] [address]   Dg Chest 2 View  Result Date: 08/24/2017 CLINICAL DATA:  27 y/o M; constipation, nausea, vomiting, fever, loss of appetite, nonproductive cough for 2 days. Appendectomy 08/12/2017. EXAM: CHEST - 2 VIEW COMPARISON:  None. FINDINGS: The heart size and mediastinal contours are within normal limits. Few linear opacities compatible with minor atelectasis. No consolidation. The visualized skeletal structures are unremarkable. IMPRESSION: Minor atelectasis.  No acute pulmonary process identified. Electronically Signed   By: Kristine Garbe M.D.   On: 08/24/2017 15:32   Ct Abdomen Pelvis W Contrast  Result Date: 08/24/2017 CLINICAL DATA:  Constipation with nausea and vomiting as well as fever. Loss of appetite. New nonproductive cough 2 days. Underwent laparoscopic appendectomy 08/12/2017 with persistent right lower quadrant pain. EXAM: CT ABDOMEN AND PELVIS WITH CONTRAST TECHNIQUE: Multidetector CT imaging of the abdomen and pelvis was performed using the standard protocol following bolus administration of intravenous contrast. CONTRAST:  146m OMNIPAQUE IOHEXOL 300 MG/ML  SOLN COMPARISON:  08/12/2017 FINDINGS: Lower chest: Lung bases are within normal. Hepatobiliary: Normal. Pancreas: Normal. Spleen: Normal. Adrenals/Urinary Tract: Normal. Stomach/Bowel: Stomach and small bowel are normal. Colon is within normal. There is persistent inflammatory change and mild free fluid over the right lower quadrant as patient has reportedly undergone laparoscopic appendectomy. There is a curvilinear tubular low density structure in the right lower quadrant over the surgical bed with slight rim enhancement which may represent persistence of the appendix or appendiceal remnant versus abscess. The largest part of this collection measures approximately 1.3 x 1.9 x 2.4 cm, although difficult to measure due to its curvilinear shape. There are  several small adjacent mesenteric lymph nodes. There is a small amount of free peritoneal air over the lateral left mid to lower abdomen. Vascular/Lymphatic: Within normal. Reproductive: Normal. Other: None. Musculoskeletal: Normal. IMPRESSION: Changes as described in the right lower quadrant post appendectomy with persistent free fluid and inflammatory change as well as a curvilinear low-density tubular structure over the surgical bed. Findings may be due to abscess versus a persistent appendix or appendiceal remnant with ongoing inflammation/infection. Small amount of free air over the left abdomen. These results were called by telephone at the time of interpretation on 08/24/2017 at 7:53 pm to Dr. PJohnney Killian who verbally acknowledged these  results. Electronically Signed   By: Marin Olp M.D.   On: 08/24/2017 19:54    Review of Systems  Constitutional: Positive for chills and fever.  Respiratory: Positive for cough. Negative for sputum production.   Gastrointestinal: Positive for constipation, nausea and vomiting.  All other systems reviewed and are negative.   Blood pressure 118/67, pulse 94, temperature 100 F (37.8 C), temperature source Oral, resp. rate (!) 21, SpO2 97 %. Physical Exam  Nursing note and vitals reviewed. Constitutional: He is oriented to person, place, and time. He appears well-developed.  Obese  HENT:  Head: Normocephalic and atraumatic.  Eyes: Pupils are equal, round, and reactive to light.  Neck: Normal range of motion. Neck supple.  Cardiovascular: Normal rate, regular rhythm and normal heart sounds.  Respiratory: Effort normal and breath sounds normal.  GI: Normal appearance and bowel sounds are normal. He exhibits distension (slight). There is tenderness in the right lower quadrant. There is tenderness at McBurney's point. There is no rigidity, no rebound and no guarding.    Musculoskeletal: Normal range of motion.  Neurological: He is alert and oriented to  person, place, and time. He has normal reflexes.  Skin: Skin is warm and dry.  Psychiatric: He has a normal mood and affect. His behavior is normal. Judgment and thought content normal.     Assessment/Plan Pericecal inflammation without a definitive abscess.  Fevers up to 102.  Chills.  WBC 18K.  Admit for IV hydration, IV antibiotics and possible further procedure if he does not improve.  Judeth Horn, MD 08/24/2017, 9:21 PM   Addendum:  Patient had an allergic reactive to the Zosyn he had been given in the ED, shortly after his initial dose had been completed.  We will use Cipro and flagyl instead.   Kathryne Eriksson. Dahlia Bailiff, MD, [place] [phone] [facility]

## 2017-08-24 NOTE — ED Notes (Signed)
Dr Wyatt @ bedside 

## 2017-08-24 NOTE — ED Notes (Signed)
Patient transported to X-ray 

## 2017-08-25 LAB — CBC
HCT: 42.6 % (ref 39.0–52.0)
Hemoglobin: 13.8 g/dL (ref 13.0–17.0)
MCH: 29.8 pg (ref 26.0–34.0)
MCHC: 32.4 g/dL (ref 30.0–36.0)
MCV: 92 fL (ref 78.0–100.0)
PLATELETS: 275 10*3/uL (ref 150–400)
RBC: 4.63 MIL/uL (ref 4.22–5.81)
RDW: 13.2 % (ref 11.5–15.5)
WBC: 12.6 10*3/uL — ABNORMAL HIGH (ref 4.0–10.5)

## 2017-08-25 LAB — BASIC METABOLIC PANEL
ANION GAP: 7 (ref 5–15)
BUN: 9 mg/dL (ref 6–20)
CALCIUM: 8.9 mg/dL (ref 8.9–10.3)
CO2: 25 mmol/L (ref 22–32)
CREATININE: 0.83 mg/dL (ref 0.61–1.24)
Chloride: 107 mmol/L (ref 98–111)
GLUCOSE: 157 mg/dL — AB (ref 70–99)
Potassium: 4.1 mmol/L (ref 3.5–5.1)
Sodium: 139 mmol/L (ref 135–145)

## 2017-08-25 MED ORDER — SODIUM CHLORIDE 0.9 % IV SOLN
1000.0000 mL | INTRAVENOUS | Status: DC
  Administered 2017-08-25: 1000 mL via INTRAVENOUS

## 2017-08-25 NOTE — Progress Notes (Signed)
[facility] Progress Note     Subjective: CC:  Denies abdominal pain, nausea, or vomiting this morning. Feels better compared to yesterday. Is having flatus and had 2-3 BMs since admission. Reports hematuria on 6/29, since resolved. Denies dysuria. Tolerating clears. Works as an Software engineer.   Objective: Vital signs in last 24 hours: Temp:  [97.4 F (36.3 C)-100 F (37.8 C)] 97.4 F (36.3 C) (07/01 0511) Pulse Rate:  [61-105] 61 (07/01 0511) Resp:  [15-28] 17 (07/01 0511) BP: (102-138)/(61-88) 105/61 (07/01 0511) SpO2:  [95 %-97 %] 97 % (07/01 0511) Weight:  [114.8 kg (253 lb 1.4 oz)] 114.8 kg (253 lb 1.4 oz) (06/30 2214) Last BM Date: 08/24/17  Intake/Output from previous day: 06/30 0701 - 07/01 0700 In: 2145.2 [P.O.:720; I.V.:425.2; IV Piggyback:1000] Out: -  Intake/Output this shift: No intake/output data recorded.  PE: Gen:  Alert, NAD, pleasant and cooperative  Pulm:  Normal effort Abd: Soft, non-tender, non-distended, bowel sounds present in all 4 quadrants, incisions C/D/I Skin: warm and dry, no rashes  Psych: A&Ox3   Lab Results:  Recent Labs    08/24/17 1320 08/25/17 0638  WBC 18.1* 12.6*  HGB 15.3 13.8  HCT 46.4 42.6  PLT 292 275   BMET Recent Labs    08/24/17 1320 08/25/17 0638  NA 135 139  K 3.9 4.1  CL 101 107  CO2 25 25  GLUCOSE 118* 157*  BUN 11 9  CREATININE 0.92 0.83  CALCIUM 9.2 8.9   PT/INR No results for input(s): LABPROT, INR in the last 72 hours. CMP     Component Value Date/Time   NA 139 08/25/2017 0638   K 4.1 08/25/2017 0638   CL 107 08/25/2017 0638   CO2 25 08/25/2017 0638   GLUCOSE 157 (H) 08/25/2017 0638   BUN 9 08/25/2017 0638   CREATININE 0.83 08/25/2017 0638   CALCIUM 8.9 08/25/2017 0638   PROT 8.7 (H) 08/24/2017 1320   ALBUMIN 3.5 08/24/2017 1320   AST 24 08/24/2017 1320   ALT 71 (H) 08/24/2017 1320   ALKPHOS 56 08/24/2017 1320   BILITOT 1.6 (H) 08/24/2017 1320   GFRNONAA >60 08/25/2017  0638   GFRAA >60 08/25/2017 0638   Lipase     Component Value Date/Time   LIPASE 29 08/24/2017 1320       Studies/Results: Dg Chest 2 View  Result Date: 08/24/2017 CLINICAL DATA:  27 y/o M; constipation, nausea, vomiting, fever, loss of appetite, nonproductive cough for 2 days. Appendectomy 08/12/2017. EXAM: CHEST - 2 VIEW COMPARISON:  None. FINDINGS: The heart size and mediastinal contours are within normal limits. Few linear opacities compatible with minor atelectasis. No consolidation. The visualized skeletal structures are unremarkable. IMPRESSION: Minor atelectasis.  No acute pulmonary process identified. Electronically Signed   By: Mitzi Hansen M.D.   On: 08/24/2017 15:32   Ct Abdomen Pelvis W Contrast  Result Date: 08/24/2017 CLINICAL DATA:  Constipation with nausea and vomiting as well as fever. Loss of appetite. New nonproductive cough 2 days. Underwent laparoscopic appendectomy 08/12/2017 with persistent right lower quadrant pain. EXAM: CT ABDOMEN AND PELVIS WITH CONTRAST TECHNIQUE: Multidetector CT imaging of the abdomen and pelvis was performed using the standard protocol following bolus administration of intravenous contrast. CONTRAST:  OMNIPAQUE IOHEXOL 300 MG/ML  SOLN COMPARISON:  08/12/2017 FINDINGS: Lower chest: Lung bases are within normal. Hepatobiliary: Normal. Pancreas: Normal. Spleen: Normal. Adrenals/Urinary Tract: Normal. Stomach/Bowel: Stomach and small bowel are normal. Colon is within normal. There is persistent inflammatory  change and mild free fluid over the right lower quadrant as patient has reportedly undergone laparoscopic appendectomy. There is a curvilinear tubular low density structure in the right lower quadrant over the surgical bed with slight rim enhancement which may represent persistence of the appendix or appendiceal remnant versus abscess. The largest part of this collection measures approximately 1.3 x 1.9 x 2.4 cm, although difficult  to measure due to its curvilinear shape. There are several small adjacent mesenteric lymph nodes. There is a small amount of free peritoneal air over the lateral left mid to lower abdomen. Vascular/Lymphatic: Within normal. Reproductive: Normal. Other: None. Musculoskeletal: Normal. IMPRESSION: Changes as described in the right lower quadrant post appendectomy with persistent free fluid and inflammatory change as well as a curvilinear low-density tubular structure over the surgical bed. Findings may be due to abscess versus a persistent appendix or appendiceal remnant with ongoing inflammation/infection. Small amount of free air over the left abdomen. These results were called by telephone at the time of interpretation on 08/24/2017 at 7:53 pm to Dr. Donnald GarrePfeiffer, who verbally acknowledged these results. Electronically Signed   By: Elberta Fortisaniel  Boyle M.D.   On: 08/24/2017 19:54    Anti-infectives: Anti-infectives (From admission, onward)   Start     Dose/Rate Route Frequency Ordered Stop   08/24/17 2215  ciprofloxacin (CIPRO) IVPB 400 mg     400 mg 200 mL/hr over 60 Minutes Intravenous Every 12 hours 08/24/17 2133     08/24/17 2215  metroNIDAZOLE (FLAGYL) IVPB 500 mg     500 mg 100 mL/hr over 60 Minutes Intravenous Every 8 hours 08/24/17 2133     08/24/17 2130  piperacillin-tazobactam (ZOSYN) IVPB 3.375 g  Status:  Discontinued     3.375 g 12.5 mL/hr over 240 Minutes Intravenous Every 8 hours 08/24/17 2121 08/24/17 2133   08/24/17 2000  piperacillin-tazobactam (ZOSYN) IVPB 3.375 g     3.375 g 100 mL/hr over 30 Minutes Intravenous  Once 08/24/17 1956 08/24/17 2115     Assessment/Plan Abdominal pain Nausea/vomiting  S/P laparoscopic appendectomy 08/12/17 Dr. Derrell Lollingamirez  -  POD#13 - afebrile, tachycardia improved s/p IVF and abx, WBC 12/6 from 18.1 -  CT abd/pelvis 08/24/17 w/ pericecal inflammatory changes and small amount of fluid. No drainable abscess. -  advance to regular as tolerated. -  Continue IV  abx; CBC in AM - If remains improved tomorrow could likely be discharged home on PO abx with outpatient follow up. He will need an updated work note.  FEN: advance diet as tolerated.  ID: Zosyn (6/30) w/ allergic reaction, cipro/flagyl 6/30 >>  VTE: SCD's, Lovenox Foley: none    LOS: 0 days    Adam PhenixElizabeth S Allyn Bartelson , [facility] 08/25/2017, 9:22 AM Pager: [phone] Consults: [phone] Mon-Fri 7:00 am-4:30 pm Sat-Sun 7:00 am-11:30 am

## 2017-08-26 LAB — CBC
HEMATOCRIT: 39.1 % (ref 39.0–52.0)
HEMOGLOBIN: 12.6 g/dL — AB (ref 13.0–17.0)
MCH: 29.9 pg (ref 26.0–34.0)
MCHC: 32.2 g/dL (ref 30.0–36.0)
MCV: 92.7 fL (ref 78.0–100.0)
Platelets: 348 10*3/uL (ref 150–400)
RBC: 4.22 MIL/uL (ref 4.22–5.81)
RDW: 13.3 % (ref 11.5–15.5)
WBC: 24 10*3/uL — ABNORMAL HIGH (ref 4.0–10.5)

## 2017-08-26 NOTE — Progress Notes (Addendum)
[facility] Progress Note     Subjective: CC-  Sitting up in chair, family at bedside. No complaints. Patient denies abdominal pain, n/v. Tolerating diet. Denies dysuria, CP, SOB, cough.  WBC up to 24 today. Afebrile, VSS.  Objective: Vital signs in last 24 hours: Temp:  [97.7 F (36.5 C)-98.7 F (37.1 C)] 98.3 F (36.8 C) (07/02 0529) Pulse Rate:  [63-95] 63 (07/02 0529) Resp:  [16-20] 18 (07/02 0529) BP: (116-132)/(63-73) 116/63 (07/02 0529) SpO2:  [96 %-98 %] 98 % (07/02 0529) Last BM Date: 08/25/17  Intake/Output from previous day: 07/01 0701 - 07/02 0700 In: 600 [P.O.:600] Out: -  Intake/Output this shift: No intake/output data recorded.  PE: Gen:  Alert, NAD, pleasant HEENT: EOM's intact, pupils equal and round Card:  RRR, no M/G/R heard Pulm:  CTAB, no W/R/R, effort normal Abd: Soft, NT/ND, +BS, no hernia, lap incisions cdi Ext:  Calves soft and nontender with no erythema or edema Psych: A&Ox3  Skin: no rashes noted, warm and dry  Lab Results:  Recent Labs    08/25/17 0638 08/26/17 0525  WBC 12.6* 24.0*  HGB 13.8 12.6*  HCT 42.6 39.1  PLT 275 348   BMET Recent Labs    08/24/17 1320 08/25/17 0638  NA 135 139  K 3.9 4.1  CL 101 107  CO2 25 25  GLUCOSE 118* 157*  BUN 11 9  CREATININE 0.92 0.83  CALCIUM 9.2 8.9   PT/INR No results for input(s): LABPROT, INR in the last 72 hours. CMP     Component Value Date/Time   NA 139 08/25/2017 0638   K 4.1 08/25/2017 0638   CL 107 08/25/2017 0638   CO2 25 08/25/2017 0638   GLUCOSE 157 (H) 08/25/2017 0638   BUN 9 08/25/2017 0638   CREATININE 0.83 08/25/2017 0638   CALCIUM 8.9 08/25/2017 0638   PROT 8.7 (H) 08/24/2017 1320   ALBUMIN 3.5 08/24/2017 1320   AST 24 08/24/2017 1320   ALT 71 (H) 08/24/2017 1320   ALKPHOS 56 08/24/2017 1320   BILITOT 1.6 (H) 08/24/2017 1320   GFRNONAA >60 08/25/2017 0638   GFRAA >60 08/25/2017 0638   Lipase     Component Value Date/Time   LIPASE 29  08/24/2017 1320       Studies/Results: Dg Chest 2 View  Result Date: 08/24/2017 CLINICAL DATA:  27 y/o M; constipation, nausea, vomiting, fever, loss of appetite, nonproductive cough for 2 days. Appendectomy 08/12/2017. EXAM: CHEST - 2 VIEW COMPARISON:  None. FINDINGS: The heart size and mediastinal contours are within normal limits. Few linear opacities compatible with minor atelectasis. No consolidation. The visualized skeletal structures are unremarkable. IMPRESSION: Minor atelectasis.  No acute pulmonary process identified. Electronically Signed   By: Mitzi Hansen M.D.   On: 08/24/2017 15:32   Ct Abdomen Pelvis W Contrast  Result Date: 08/24/2017 CLINICAL DATA:  Constipation with nausea and vomiting as well as fever. Loss of appetite. New nonproductive cough 2 days. Underwent laparoscopic appendectomy 08/12/2017 with persistent right lower quadrant pain. EXAM: CT ABDOMEN AND PELVIS WITH CONTRAST TECHNIQUE: Multidetector CT imaging of the abdomen and pelvis was performed using the standard protocol following bolus administration of intravenous contrast. CONTRAST:  OMNIPAQUE IOHEXOL 300 MG/ML  SOLN COMPARISON:  08/12/2017 FINDINGS: Lower chest: Lung bases are within normal. Hepatobiliary: Normal. Pancreas: Normal. Spleen: Normal. Adrenals/Urinary Tract: Normal. Stomach/Bowel: Stomach and small bowel are normal. Colon is within normal. There is persistent inflammatory change and mild free fluid over the right  lower quadrant as patient has reportedly undergone laparoscopic appendectomy. There is a curvilinear tubular low density structure in the right lower quadrant over the surgical bed with slight rim enhancement which may represent persistence of the appendix or appendiceal remnant versus abscess. The largest part of this collection measures approximately 1.3 x 1.9 x 2.4 cm, although difficult to measure due to its curvilinear shape. There are several small adjacent mesenteric lymph  nodes. There is a small amount of free peritoneal air over the lateral left mid to lower abdomen. Vascular/Lymphatic: Within normal. Reproductive: Normal. Other: None. Musculoskeletal: Normal. IMPRESSION: Changes as described in the right lower quadrant post appendectomy with persistent free fluid and inflammatory change as well as a curvilinear low-density tubular structure over the surgical bed. Findings may be due to abscess versus a persistent appendix or appendiceal remnant with ongoing inflammation/infection. Small amount of free air over the left abdomen. These results were called by telephone at the time of interpretation on 08/24/2017 at 7:53 pm to Dr. Donnald GarrePfeiffer, who verbally acknowledged these results. Electronically Signed   By: Elberta Fortisaniel  Boyle M.D.   On: 08/24/2017 19:54    Anti-infectives: Anti-infectives (From admission, onward)   Start     Dose/Rate Route Frequency Ordered Stop   08/24/17 2215  ciprofloxacin (CIPRO) IVPB 400 mg     400 mg 200 mL/hr over 60 Minutes Intravenous Every 12 hours 08/24/17 2133     08/24/17 2215  metroNIDAZOLE (FLAGYL) IVPB 500 mg     500 mg 100 mL/hr over 60 Minutes Intravenous Every 8 hours 08/24/17 2133     08/24/17 2130  piperacillin-tazobactam (ZOSYN) IVPB 3.375 g  Status:  Discontinued     3.375 g 12.5 mL/hr over 240 Minutes Intravenous Every 8 hours 08/24/17 2121 08/24/17 2133   08/24/17 2000  piperacillin-tazobactam (ZOSYN) IVPB 3.375 g     3.375 g 100 mL/hr over 30 Minutes Intravenous  Once 08/24/17 1956 08/24/17 2115       Assessment/Plan Abdominal pain Nausea/vomiting  S/P laparoscopic appendectomy 08/12/17 Dr. Derrell Lollingamirez  - POD#14 - CT abd/pelvis 08/24/17 w/ pericecal inflammatory changes and small amount of fluid. No drainable abscess.  FEN: regular diet ID: Zosyn (6/30) w/ allergic reaction, cipro/flagyl 6/30 >>  VTE: SCD's, Lovenox Foley: none   Plan: Patient tolerating diet, abdominal pain resolved, VSS, but WBC up today to 24. No  other symptoms to explain leukocytosis. Continue cipro/flagyl today and repeat CBC in AM.   LOS: 1 day    Franne FortsBrooke A Meuth , [facility] 08/26/2017, 9:55 AM Pager: [phone] Consults: [phone] Mon 7:00 am -11:30 AM Tues-Fri 7:00 am-4:30 pm Sat-Sun 7:00 am-11:30 am

## 2017-08-27 ENCOUNTER — Other Ambulatory Visit: Payer: Self-pay

## 2017-08-27 LAB — CBC
HEMATOCRIT: 40.9 % (ref 39.0–52.0)
HEMOGLOBIN: 13.2 g/dL (ref 13.0–17.0)
MCH: 29.5 pg (ref 26.0–34.0)
MCHC: 32.3 g/dL (ref 30.0–36.0)
MCV: 91.5 fL (ref 78.0–100.0)
Platelets: 349 10*3/uL (ref 150–400)
RBC: 4.47 MIL/uL (ref 4.22–5.81)
RDW: 13.3 % (ref 11.5–15.5)
WBC: 9.4 10*3/uL (ref 4.0–10.5)

## 2017-08-27 MED ORDER — METRONIDAZOLE 500 MG PO TABS: 500.0000 mg | ORAL_TABLET | Freq: Three times a day (TID) | ORAL | 0 refills | Status: AC

## 2017-08-27 MED ORDER — CIPROFLOXACIN HCL 500 MG PO TABS: 500.0000 mg | ORAL_TABLET | Freq: Two times a day (BID) | ORAL | 0 refills | Status: AC

## 2017-08-27 NOTE — Discharge Summary (Signed)
  [facility] Discharge Summary   Patient ID: Ryan Richardson MRN: [id number] DOB/AGE: Mar 30, 1990 27 y.o.  Admit date: 08/24/2017 Discharge date: 08/27/2017  Admitting Diagnosis: Pericecal inflammation    Discharge Diagnosis Patient Active Problem List   Diagnosis Date Noted  . Peritonitis (HCC) 08/24/2017  . Acute perforated appendicitis 08/12/2017    Consultants None  Imaging: No results found.  Procedures None this admission  Hospital Course:  Ryan PayorDavid Byland is a 26yo male s/p laparoscopic appendectomy 08/12/17 for perforated appendicitis, [facility] with new onset increased abdominal pain, fever, and chills.  Workup included CT scan which showed pericecal inflammatory changes without abscess or perforation.  Patient was admitted for IV antibiotics and monitoring. Initially started on zosyn but developed a rash and antibiotics were changed to cipro/flagyl.  Patient's symptoms resolved and WBC normalized. Diet was advanced as tolerated. On 7/3 the patient was voiding well, tolerating diet, ambulating well, pain well controlled, vital signs stable and felt stable for discharge home. He will continue cipro/flagyl for 10 more days. Patient will follow up as below and knows to call with questions or concerns.     Physical Exam: Gen:  Alert, NAD, pleasant HEENT: EOM's intact, pupils equal and round Card:  RRR, no M/G/R heard Pulm:  CTAB, no W/R/R, effort normal Abd: Soft, NT/ND, +BS, no hernia, lap incisions cdi Ext:  Calves soft and nontender with no erythema or edema Psych: A&Ox3  Skin: no rashes noted, warm and dry    Allergies as of 08/27/2017      Reactions   Zosyn [piperacillin Sod-tazobactam So] Shortness Of Breath, Itching, Swelling, Rash   Face red and swollen Rash on chest and neck      Medication List    STOP taking these medications   amoxicillin-clavulanate 875-125 MG tablet Commonly known as:  AUGMENTIN     TAKE these medications    ciprofloxacin 500 MG tablet Commonly known as:  CIPRO Take 1 tablet (500 mg total) by mouth 2 (two) times daily for 10 days.   ibuprofen 800 MG tablet Commonly known as:  ADVIL,MOTRIN Take 1 tablet (800 mg total) by mouth every 8 (eight) hours as needed.   metroNIDAZOLE 500 MG tablet Commonly known as:  FLAGYL Take 1 tablet (500 mg total) by mouth 3 (three) times daily for 10 days.   oxyCODONE 5 MG immediate release tablet Commonly known as:  Oxy IR/ROXICODONE Take 1 tablet (5 mg total) by mouth every 6 (six) hours as needed for severe pain.        Follow-up Information    [facility] [place]).   Specialty:  General Surgery Why:  Call as soon as you leave the hospital to arrange follow up for 1-2 weeks Contact information: [address] [place] [id number] [phone]          Signed: Franne FortsBrooke A Kylia Grajales, [facility] 08/27/2017, 7:57 AM Pager: [phone] Consults: [phone] Mon 7:00 am -11:30 AM Tues-Fri 7:00 am-4:30 pm Sat-Sun 7:00 am-11:30 am

## 2017-08-27 NOTE — Discharge Instructions (Signed)
Take antibiotics as prescribed Recommend picking up a probiotic (over the counter) from the pharmacy Call with concerns (ie increased abdominal pain, nausea, vomiting, fever...) Follow up in 1-2 weeks

## 2017-08-27 NOTE — Care Management Note (Signed)
Case Management Note  Patient Details  Name: Ryan Richardson MRN: [id number] Date of Birth: [birth year]  Subjective/Objective:                    Action/Plan:   Expected Discharge Date:  08/27/17               Expected Discharge Plan:  Home/Self Care  In-House Referral:  Financial Counselor  Discharge planning Services  CM Consult, Medication Assistance, [facility]igent [facility], [facility]  Post Acute Care Choice:  NA Choice offered to:  Patient, Adult Children  DME Arranged:  N/A DME Agency:  NA  HH Arranged:  NA HH Agency:  NA  Status of Service:  Completed, signed off  If discussed at Long Length of Stay Meetings, dates discussed:    Additional Comments:  Ryan Richardson, Ryan Gilberg Marie, RN 08/27/2017, 9:57 AM

## 2017-08-27 NOTE — Progress Notes (Signed)
Discharge instructions reviewed with Pt and family members.  Pt to take his Cipro PO after discharge home.  He will receive match letter for medication. Pt discharge to home with family Pt to leave ambulatory home with family. Pt has prescriptions, match letter, prescriptions and work letter excuse.

## 2017-08-29 NOTE — ED Provider Notes (Signed)
       Patient CARE assumed from Dr. Lynelle DoctorKnapp.  Follow-up CT showed inflammatory changes but not obvious abscess.  Consultation was made to Dr. Lindie SpruceWyatt.  Patient was started on Zosyn.  Patient had allergic reaction to Zosyn with hives, diffuse erythema and throat clearing.  He was treated with epinephrine, Benadryl and Solu-Medrol.  Patient was admitted to Dr. Lindie SpruceWyatt for ongoing antibiotic therapy and monitoring for fever and leukocytosis post appendectomy.   Arby BarrettePfeiffer, Abi Shoults, MD 08/29/17 [phone]

## 2019-10-16 IMAGING — CT CT ABD-PELV W/ CM
2 of 4 series · 16 of 46 positions shown, 18 images · IV contrast (Omni 300)
Comparison: 08/12/2017

CLINICAL DATA: Constipation with nausea and vomiting as well as
fever. Loss of appetite. New nonproductive cough 2 days. Underwent
laparoscopic appendectomy 08/12/2017 with persistent right lower
quadrant pain.

EXAM:
CT ABDOMEN AND PELVIS WITH CONTRAST
TECHNIQUE: Multidetector CT imaging of the abdomen and pelvis was performed
using the standard protocol following bolus administration of
intravenous contrast.
CONTRAST:  100mL OMNIPAQUE IOHEXOL 300 MG/ML  SOLN

[Series 3: a/p w/ 5mm · axial · 0.93mm/px · z∈[+804,+1249]mm · 13 of 99 slices shown, 15 images]
[im 5/99  soft-tissue]
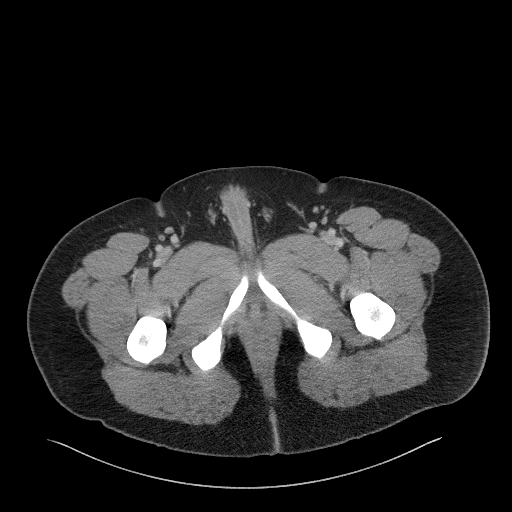
[im 5/99  bone]
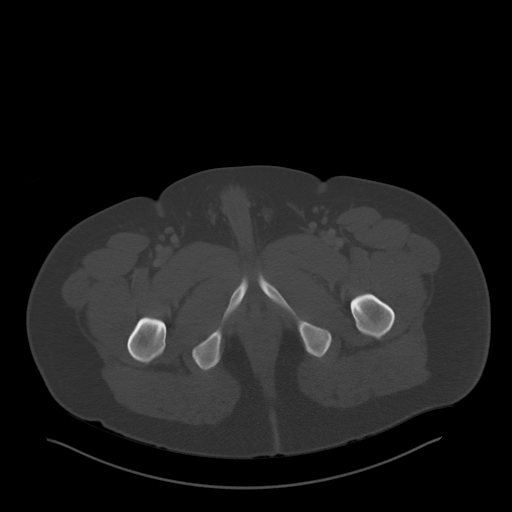
[im 13/99  soft-tissue]
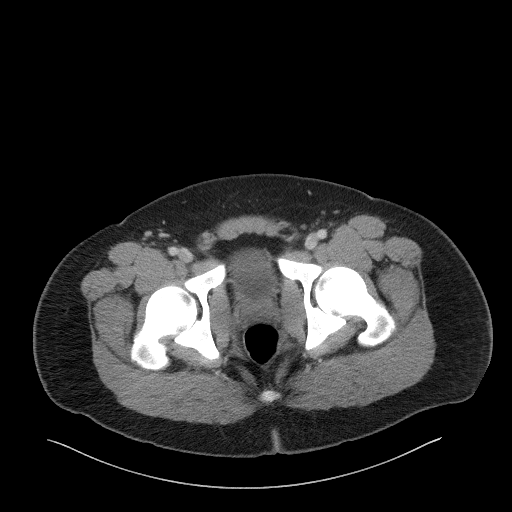
[im 21/99  soft-tissue]
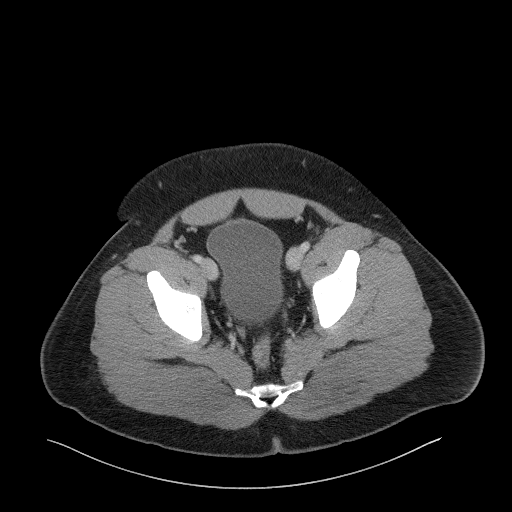
[im 29/99  soft-tissue]
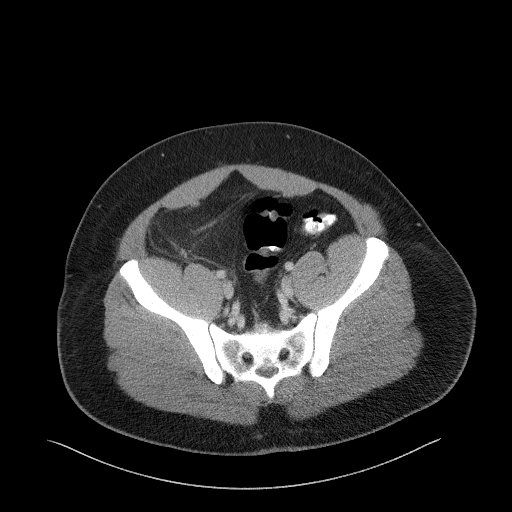
[im 33/99  soft-tissue]
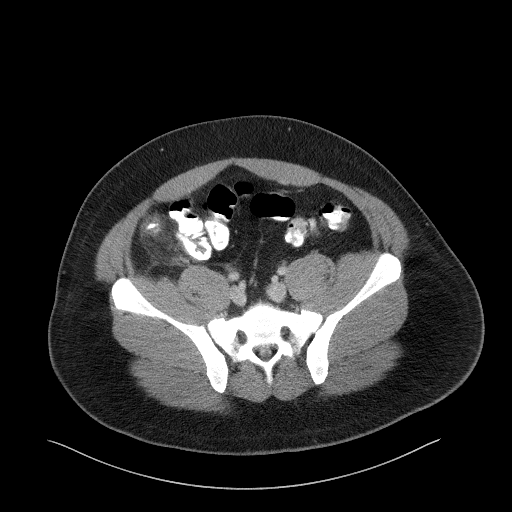
[im 41/99  soft-tissue]
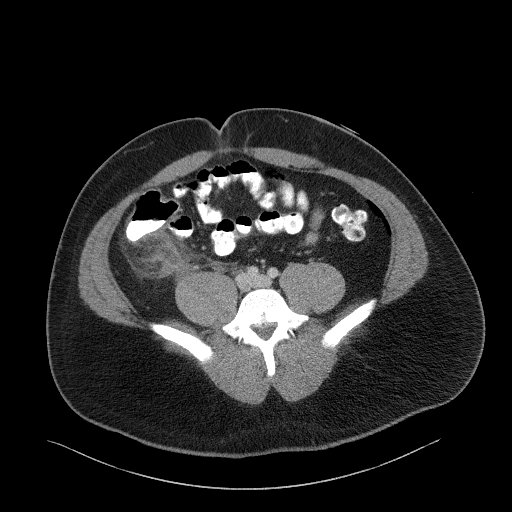
[im 50/99  soft-tissue]
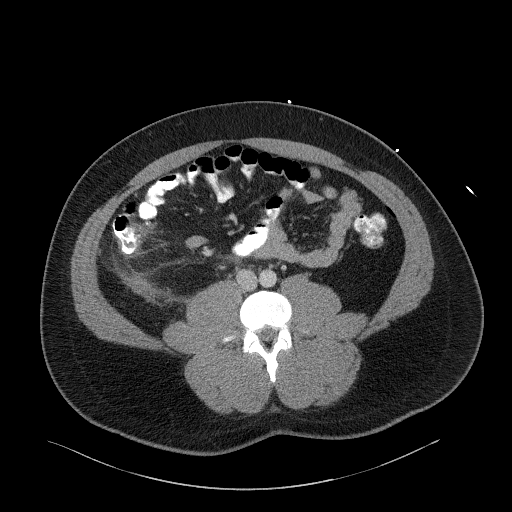
[im 58/99  soft-tissue]
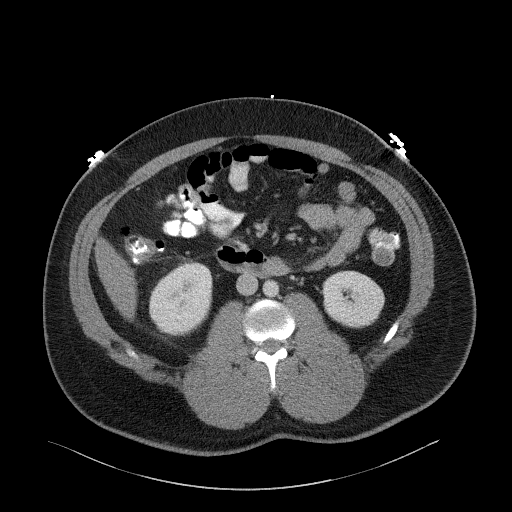
[im 66/99  soft-tissue]
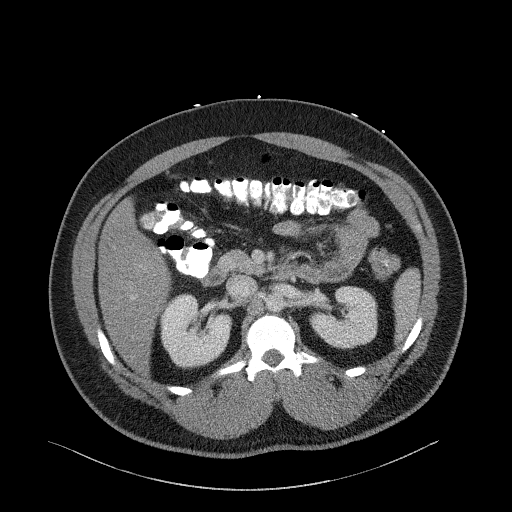
[im 66/99  bone]
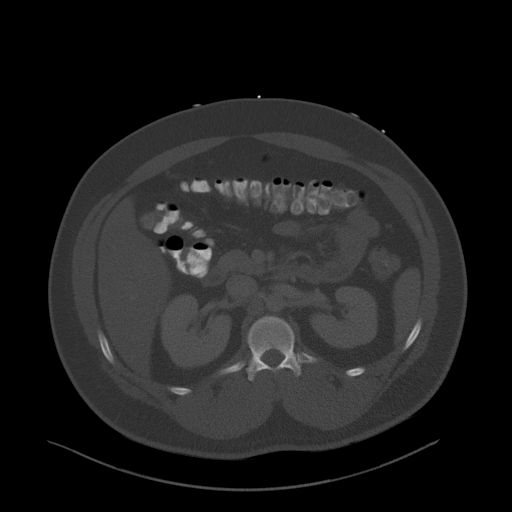
[im 70/99  soft-tissue]
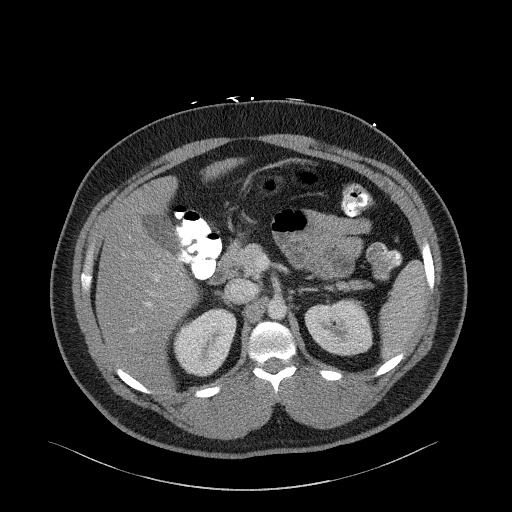
[im 78/99  soft-tissue]
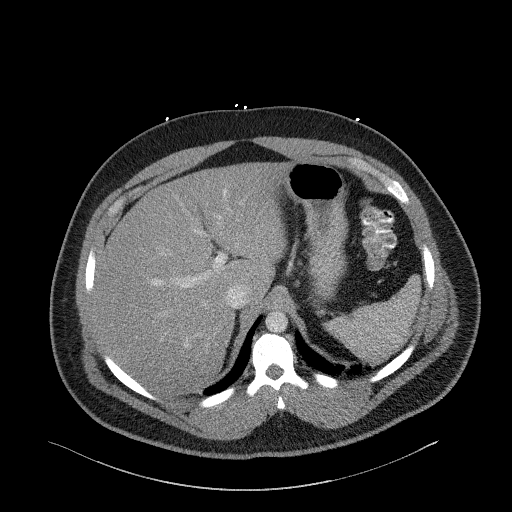
[im 86/99  soft-tissue]
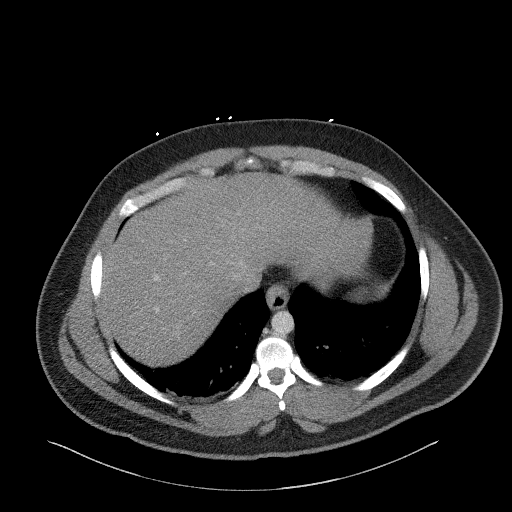
[im 94/99  soft-tissue]
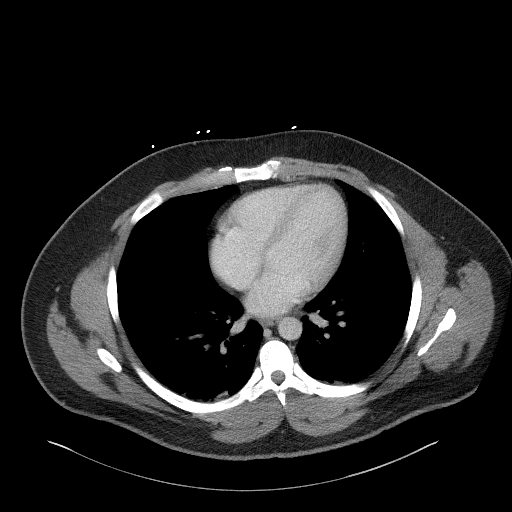

[Series 6: a/p w/ cor · coronal · 0.96mm/px · 3 of 164 slices shown]
[im 55/164  soft-tissue]
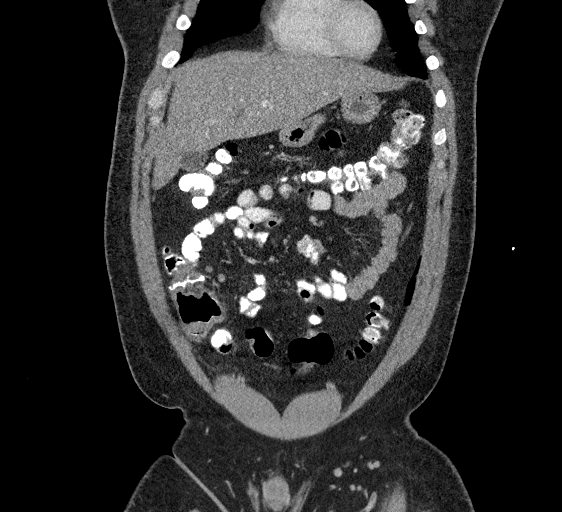
[im 73/164  soft-tissue]
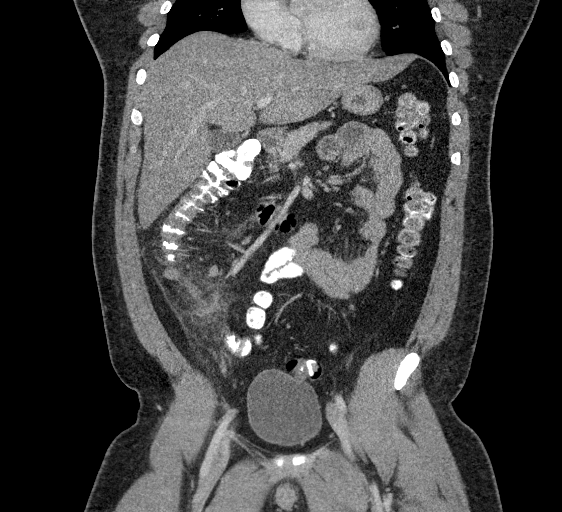
[im 91/164  soft-tissue]
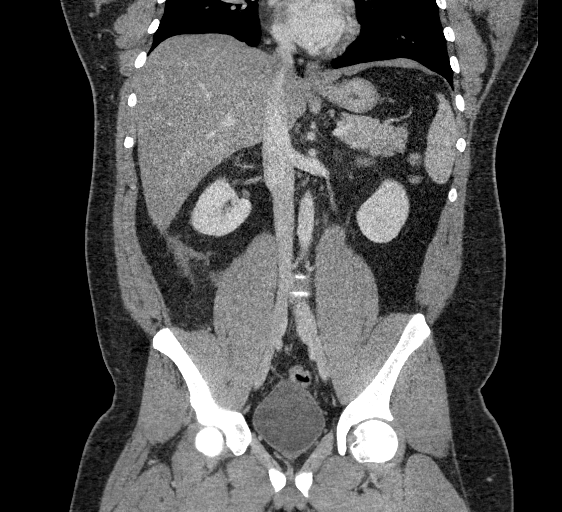

[16 of 46 positions shown; findings below may reference images not displayed]

FINDINGS: Lower chest: Lung bases are within normal.

Hepatobiliary: Normal.

Pancreas: Normal.

Spleen: Normal.

Adrenals/Urinary Tract: Normal.

Stomach/Bowel: Stomach and small bowel are normal. Colon is within
normal.

There is persistent inflammatory change and mild free fluid over the
right lower quadrant as patient has reportedly undergone
laparoscopic appendectomy. There is a curvilinear tubular low
density structure in the right lower quadrant over the surgical bed
with slight rim enhancement which may represent persistence of the
appendix or appendiceal remnant versus abscess. The largest part of
this collection measures approximately 1.3 x 1.9 x 2.4 cm, although
difficult to measure due to its curvilinear shape. There are several
small adjacent mesenteric lymph nodes. There is a small amount of
free peritoneal air over the lateral left mid to lower abdomen.

Vascular/Lymphatic: Within normal.

Reproductive: Normal.

Other: None.

Musculoskeletal: Normal.
IMPRESSION: Changes as described in the right lower quadrant post appendectomy
with persistent free fluid and inflammatory change as well as a
curvilinear low-density tubular structure over the surgical bed.
Findings may be due to abscess versus a persistent appendix or
appendiceal remnant with ongoing inflammation/infection. Small
amount of free air over the left abdomen.

These results were called by telephone at the time of interpretation
on 08/24/2017 at [DATE] to Dr. Senteno, who verbally acknowledged
these results.

## 2023-11-22 ENCOUNTER — Emergency Department (HOSPITAL_COMMUNITY)
Admission: EM | Admit: 2023-11-22 | Discharge: 2023-11-23 | Disposition: A | Payer: Self-pay | Attending: Emergency Medicine | Admitting: Emergency Medicine

## 2023-11-22 ENCOUNTER — Encounter (HOSPITAL_COMMUNITY): Payer: Self-pay | Admitting: *Deleted

## 2023-11-22 ENCOUNTER — Other Ambulatory Visit: Payer: Self-pay

## 2023-11-22 DIAGNOSIS — W268XXA Contact with other sharp object(s), not elsewhere classified, initial encounter: Secondary | ICD-10-CM | POA: Insufficient documentation

## 2023-11-22 DIAGNOSIS — Z23 Encounter for immunization: Secondary | ICD-10-CM | POA: Insufficient documentation

## 2023-11-22 DIAGNOSIS — S61214A Laceration without foreign body of right ring finger without damage to nail, initial encounter: Secondary | ICD-10-CM | POA: Insufficient documentation

## 2023-11-22 MED ORDER — BUPIVACAINE HCL (PF) 0.5 % IJ SOLN
20.0000 mL | Freq: Once | INTRAMUSCULAR | Status: AC
  Administered 2023-11-23: 20 mL
  Filled 2023-11-22: qty 20

## 2023-11-22 MED ORDER — TETANUS-DIPHTH-ACELL PERTUSSIS 5-2.5-18.5 LF-MCG/0.5 IM SUSY
0.5000 mL | PREFILLED_SYRINGE | Freq: Once | INTRAMUSCULAR | Status: AC
  Administered 2023-11-23: 0.5 mL via INTRAMUSCULAR
  Filled 2023-11-22: qty 0.5

## 2023-11-22 NOTE — ED Triage Notes (Signed)
 PT cut his finger while working on an exhaust pipe.  Now it's beginning to feel numb.  Bleeding is controlled. Unknown last tetanus.

## 2023-11-22 NOTE — ED Provider Notes (Incomplete)
  [place] EMERGENCY DEPARTMENT AT [facility] Provider Note   CSN: [id number] Arrival date & time: 11/22/23  2249     Patient presents with: Extremity Laceration   Ryan Richardson is a 33 y.o. male.  Patient presents to the emergency room complaining of a laceration to the right ring finger.  Patient was working on an exhaust pipe and cut himself on the metal.  Bleeding controlled at the time of my assessment.  Patient is unsure of the last time he had a tetanus shot.  {Add pertinent medical, surgical, social history, OB history to HPI:32947} HPI     Prior to Admission medications   Medication Sig Start Date End Date Taking? Authorizing Provider  ibuprofen  (ADVIL ,MOTRIN ) 800 MG tablet Take 1 tablet (800 mg total) by mouth every 8 (eight) hours as needed. 08/14/17   Cornett, Debby, MD  oxyCODONE  (OXY IR/ROXICODONE ) 5 MG immediate release tablet Take 1 tablet (5 mg total) by mouth every 6 (six) hours as needed for severe pain. 08/14/17   Cornett, Debby, MD    Allergies: Zosyn  [piperacillin  sod-tazobactam so]    Review of Systems  Updated Vital Signs BP 135/82 (BP Location: Left Arm)   Pulse 76   Temp 99.2 F (37.3 C)   Resp 16   Ht 5' 6 (1.676 m)   Wt 114.8 kg   SpO2 97%   BMI 40.85 kg/m   Physical Exam Vitals and nursing note reviewed.  HENT:     Head: Normocephalic and atraumatic.  Eyes:     Pupils: Pupils are equal, round, and reactive to light.  Pulmonary:     Effort: Pulmonary effort is normal. No respiratory distress.  Musculoskeletal:        General: No signs of injury.     Cervical back: Normal range of motion.  Skin:    General: Skin is dry.     Comments: Laceration to the medial side of the 4th digit of the right hand  Neurological:     Mental Status: He is alert.  Psychiatric:        Speech: Speech normal.        Behavior: Behavior normal.     (all labs ordered are listed, but only abnormal results are displayed) Labs Reviewed - No  data to display  EKG: None  Radiology: No results found.  {Document cardiac monitor, telemetry assessment procedure when appropriate:32947} Procedures   Medications Ordered in the ED  bupivacaine (PF) (MARCAINE ) 0.5 % injection 20 mL (has no administration in time range)  Tdap (BOOSTRIX) injection 0.5 mL (has no administration in time range)      {Click here for ABCD2, HEART and other calculators REFRESH Note before signing:1}                              Medical Decision Making Risk Prescription drug management.   ***  {Document critical care time when appropriate  Document review of labs and clinical decision tools ie CHADS2VASC2, etc  Document your independent review of radiology images and any outside records  Document your discussion with family members, caretakers and with consultants  Document social determinants of health affecting pt's care  Document your decision making why or why not admission, treatments were needed:32947:::1}   Final diagnoses:  None    ED Discharge Orders     None

## 2023-11-22 NOTE — ED Provider Notes (Signed)
  [place] EMERGENCY DEPARTMENT AT [facility] Provider Note   CSN: [id number] Arrival date & time: 11/22/23  2249     Patient presents with: Extremity Laceration   Ryan Richardson is a 33 y.o. male.  {Add pertinent medical, surgical, social history, OB history to HPI:32947} HPI     Prior to Admission medications   Medication Sig Start Date End Date Taking? Authorizing Provider  ibuprofen  (ADVIL ,MOTRIN ) 800 MG tablet Take 1 tablet (800 mg total) by mouth every 8 (eight) hours as needed. 08/14/17   Cornett, Debby, MD  oxyCODONE  (OXY IR/ROXICODONE ) 5 MG immediate release tablet Take 1 tablet (5 mg total) by mouth every 6 (six) hours as needed for severe pain. 08/14/17   Cornett, Debby, MD    Allergies: Zosyn  [piperacillin  sod-tazobactam so]    Review of Systems  Updated Vital Signs BP 135/82 (BP Location: Left Arm)   Pulse 76   Temp 99.2 F (37.3 C)   Resp 16   Ht 5' 6 (1.676 m)   Wt 114.8 kg   SpO2 97%   BMI 40.85 kg/m   Physical Exam  (all labs ordered are listed, but only abnormal results are displayed) Labs Reviewed - No data to display  EKG: None  Radiology: No results found.  {Document cardiac monitor, telemetry assessment procedure when appropriate:32947} Procedures   Medications Ordered in the ED  bupivacaine (PF) (MARCAINE ) 0.5 % injection 20 mL (has no administration in time range)  Tdap (BOOSTRIX) injection 0.5 mL (has no administration in time range)      {Click here for ABCD2, HEART and other calculators REFRESH Note before signing:1}                              Medical Decision Making Risk Prescription drug management.   ***  {Document critical care time when appropriate  Document review of labs and clinical decision tools ie [id number], etc  Document your independent review of radiology images and any outside records  Document your discussion with family members, caretakers and with consultants  Document social  determinants of health affecting pt's care  Document your decision making why or why not admission, treatments were needed:32947:::1}   Final diagnoses:  None    ED Discharge Orders     None

## 2023-11-23 NOTE — Discharge Instructions (Signed)
###   Finger Suture Discharge Instructions     **ENGLISH**      **Care for Your Finger Sutures**      - Keep the wound clean and covered with a bandage. Gently wash the area with soap and water. Pat dry and put on a clean bandage each day.      - It is safe to get the wound wet after the first 24 hours. Do not soak the finger in water (no swimming or baths) until the sutures are removed.      - Apply plain petroleum jelly (like Vaseline) to the wound before covering it. This helps keep the area moist and can help healing.      - Watch for signs of infection: redness, swelling, warmth, pus, or increasing pain. If you notice any of these, contact your doctor.      - If the wound starts bleeding, press gently with a clean cloth for 10-15 minutes.      - Avoid heavy use of the finger (no sports, heavy lifting, or strenuous activity) until the sutures are removed.      - You may take acetaminophen  (Tylenol ) for pain, as needed, following package instructions.      - Return to the clinic in **7 to 10 days** to have the sutures removed. Do not try to remove the sutures yourself.      - If you have not had a tetanus shot in the last 10 years, ask your doctor if you need one.      ---      **ESPAOL**      **Cuidado de los puntos en el dedo**      - Mantenga la herida limpia y [place] con una venda. Puede lavar suavemente el rea con agua y jabn. Squela con palmaditas y coloque una venda limpia cada da.      - Es seguro mojar la herida despus de las primeras 24 horas. No sumerja el dedo en agua (no nadar ni baos) hasta que se retiren los puntos.      - Aplique vaselina (petrolato) en la herida antes de cubrirla. Esto ayuda a Radio producer rea hmeda y favorece la curacin.      - Est atento a signos de infeccin: enrojecimiento, hinchazn, calor, pus o dolor creciente. Si nota alguno de estos, comunquese con su mdico.      - Si la herida comienza a Geophysicist/field seismologist, presione suavemente con  un pao limpio durante 10-15 minutos.      - Evite usar [employer] dedo (no deportes, levantar objetos pesados ni actividades intensas) hasta que se retiren los puntos.      - Puede tomar paracetamol (Tylenol ) para el dolor, segn las indicaciones del paquete.      - Regrese a Glass blower/designer en **7 a 10 das** para que le retiren los puntos. No intente quitarlos usted mismo.      - Si no ha recibido Geophysical data processor ttanos en los ltimos 10 aos, pregunte a su mdico si la necesita.
